# Patient Record
Sex: Female | Born: 1945
Health system: Southern US, Community
[De-identification: ages and names within clinical notes are randomized; demographics above are authoritative.]

## PROBLEM LIST (undated history)

## (undated) DIAGNOSIS — E039 Hypothyroidism, unspecified: Secondary | ICD-10-CM

## (undated) DIAGNOSIS — M65841 Other synovitis and tenosynovitis, right hand: Secondary | ICD-10-CM

## (undated) DIAGNOSIS — R05 Cough: Secondary | ICD-10-CM

## (undated) DIAGNOSIS — R053 Chronic cough: Secondary | ICD-10-CM

## (undated) DIAGNOSIS — E7800 Pure hypercholesterolemia, unspecified: Secondary | ICD-10-CM

## (undated) DIAGNOSIS — Z98811 Dental restoration status: Secondary | ICD-10-CM

## (undated) HISTORY — PX: ABDOMINAL HYSTERECTOMY: SHX81

## (undated) HISTORY — PX: TONSILLECTOMY AND ADENOIDECTOMY: SHX28

## (undated) HISTORY — PX: APPENDECTOMY: SHX54

---

## 1996-02-05 HISTORY — PX: THYROIDECTOMY, PARTIAL: SHX18

## 1997-05-09 ENCOUNTER — Ambulatory Visit (HOSPITAL_COMMUNITY): Admission: RE | Admit: 1997-05-09 | Discharge: 1997-05-10 | Payer: Self-pay | Admitting: General Surgery

## 1997-05-12 ENCOUNTER — Other Ambulatory Visit: Admission: RE | Admit: 1997-05-12 | Discharge: 1997-05-12 | Payer: Self-pay | Admitting: *Deleted

## 1998-06-06 ENCOUNTER — Other Ambulatory Visit: Admission: RE | Admit: 1998-06-06 | Discharge: 1998-06-06 | Payer: Self-pay | Admitting: *Deleted

## 1998-12-13 ENCOUNTER — Encounter: Admission: RE | Admit: 1998-12-13 | Discharge: 1998-12-13 | Payer: Self-pay | Admitting: Gastroenterology

## 1998-12-13 ENCOUNTER — Encounter: Payer: Self-pay | Admitting: Gastroenterology

## 1998-12-19 ENCOUNTER — Encounter: Admission: RE | Admit: 1998-12-19 | Discharge: 1998-12-19 | Payer: Self-pay | Admitting: Gastroenterology

## 1998-12-19 ENCOUNTER — Encounter: Payer: Self-pay | Admitting: Gastroenterology

## 1999-03-15 ENCOUNTER — Encounter: Admission: RE | Admit: 1999-03-15 | Discharge: 1999-03-15 | Payer: Self-pay | Admitting: Gastroenterology

## 1999-03-15 ENCOUNTER — Encounter: Payer: Self-pay | Admitting: Gastroenterology

## 1999-08-21 ENCOUNTER — Other Ambulatory Visit: Admission: RE | Admit: 1999-08-21 | Discharge: 1999-08-21 | Payer: Self-pay | Admitting: *Deleted

## 1999-12-25 ENCOUNTER — Encounter: Payer: Self-pay | Admitting: *Deleted

## 1999-12-25 ENCOUNTER — Encounter: Admission: RE | Admit: 1999-12-25 | Discharge: 1999-12-25 | Payer: Self-pay

## 2000-09-02 ENCOUNTER — Other Ambulatory Visit: Admission: RE | Admit: 2000-09-02 | Discharge: 2000-09-02 | Payer: Self-pay | Admitting: *Deleted

## 2000-12-25 ENCOUNTER — Encounter: Admission: RE | Admit: 2000-12-25 | Discharge: 2000-12-25 | Payer: Self-pay | Admitting: *Deleted

## 2000-12-25 ENCOUNTER — Encounter: Payer: Self-pay | Admitting: *Deleted

## 2000-12-30 ENCOUNTER — Encounter: Admission: RE | Admit: 2000-12-30 | Discharge: 2000-12-30 | Payer: Self-pay | Admitting: Gastroenterology

## 2000-12-30 ENCOUNTER — Encounter: Payer: Self-pay | Admitting: *Deleted

## 2000-12-30 ENCOUNTER — Encounter: Admission: RE | Admit: 2000-12-30 | Discharge: 2000-12-30 | Payer: Self-pay | Admitting: *Deleted

## 2000-12-30 ENCOUNTER — Encounter: Payer: Self-pay | Admitting: Gastroenterology

## 2001-09-22 ENCOUNTER — Other Ambulatory Visit: Admission: RE | Admit: 2001-09-22 | Discharge: 2001-09-22 | Payer: Self-pay | Admitting: Obstetrics and Gynecology

## 2002-01-05 ENCOUNTER — Encounter: Payer: Self-pay | Admitting: Obstetrics and Gynecology

## 2002-01-05 ENCOUNTER — Encounter: Admission: RE | Admit: 2002-01-05 | Discharge: 2002-01-05 | Payer: Self-pay | Admitting: Obstetrics and Gynecology

## 2002-03-23 ENCOUNTER — Ambulatory Visit (HOSPITAL_COMMUNITY): Admission: RE | Admit: 2002-03-23 | Discharge: 2002-03-23 | Payer: Self-pay | Admitting: Gastroenterology

## 2002-09-28 ENCOUNTER — Other Ambulatory Visit: Admission: RE | Admit: 2002-09-28 | Discharge: 2002-09-28 | Payer: Self-pay | Admitting: Obstetrics and Gynecology

## 2003-01-11 ENCOUNTER — Encounter: Admission: RE | Admit: 2003-01-11 | Discharge: 2003-01-11 | Payer: Self-pay | Admitting: Obstetrics and Gynecology

## 2003-11-08 ENCOUNTER — Encounter: Admission: RE | Admit: 2003-11-08 | Discharge: 2003-11-08 | Payer: Self-pay | Admitting: Obstetrics and Gynecology

## 2004-01-12 ENCOUNTER — Encounter: Admission: RE | Admit: 2004-01-12 | Discharge: 2004-01-12 | Payer: Self-pay | Admitting: Obstetrics and Gynecology

## 2004-05-03 ENCOUNTER — Encounter: Admission: RE | Admit: 2004-05-03 | Discharge: 2004-05-03 | Payer: Self-pay | Admitting: Otolaryngology

## 2005-01-15 ENCOUNTER — Encounter: Admission: RE | Admit: 2005-01-15 | Discharge: 2005-01-15 | Payer: Self-pay | Admitting: Obstetrics and Gynecology

## 2005-01-16 ENCOUNTER — Encounter (HOSPITAL_COMMUNITY): Admission: RE | Admit: 2005-01-16 | Discharge: 2005-04-16 | Payer: Self-pay | Admitting: Gastroenterology

## 2005-02-26 ENCOUNTER — Ambulatory Visit (HOSPITAL_COMMUNITY): Admission: RE | Admit: 2005-02-26 | Discharge: 2005-02-26 | Payer: Self-pay | Admitting: Endocrinology

## 2005-03-12 ENCOUNTER — Other Ambulatory Visit: Admission: RE | Admit: 2005-03-12 | Discharge: 2005-03-12 | Payer: Self-pay | Admitting: Interventional Radiology

## 2005-03-12 ENCOUNTER — Encounter: Admission: RE | Admit: 2005-03-12 | Discharge: 2005-03-12 | Payer: Self-pay | Admitting: Endocrinology

## 2005-03-12 ENCOUNTER — Encounter (INDEPENDENT_AMBULATORY_CARE_PROVIDER_SITE_OTHER): Payer: Self-pay | Admitting: *Deleted

## 2005-06-10 ENCOUNTER — Encounter (INDEPENDENT_AMBULATORY_CARE_PROVIDER_SITE_OTHER): Payer: Self-pay | Admitting: Specialist

## 2005-06-10 ENCOUNTER — Ambulatory Visit (HOSPITAL_COMMUNITY): Admission: RE | Admit: 2005-06-10 | Discharge: 2005-06-11 | Payer: Self-pay | Admitting: Surgery

## 2005-06-10 HISTORY — PX: THYROIDECTOMY: SHX17

## 2005-12-21 ENCOUNTER — Emergency Department (HOSPITAL_COMMUNITY): Admission: EM | Admit: 2005-12-21 | Discharge: 2005-12-21 | Payer: Self-pay | Admitting: Emergency Medicine

## 2006-01-16 ENCOUNTER — Encounter: Admission: RE | Admit: 2006-01-16 | Discharge: 2006-01-16 | Payer: Self-pay | Admitting: Obstetrics and Gynecology

## 2006-11-27 ENCOUNTER — Encounter: Admission: RE | Admit: 2006-11-27 | Discharge: 2006-11-27 | Payer: Self-pay | Admitting: Obstetrics and Gynecology

## 2007-01-27 ENCOUNTER — Encounter: Admission: RE | Admit: 2007-01-27 | Discharge: 2007-01-27 | Payer: Self-pay | Admitting: Gastroenterology

## 2007-02-17 ENCOUNTER — Encounter: Admission: RE | Admit: 2007-02-17 | Discharge: 2007-02-17 | Payer: Self-pay | Admitting: Neurology

## 2007-02-19 ENCOUNTER — Ambulatory Visit (HOSPITAL_COMMUNITY): Admission: RE | Admit: 2007-02-19 | Discharge: 2007-02-19 | Payer: Self-pay | Admitting: Neurology

## 2007-07-09 ENCOUNTER — Ambulatory Visit: Payer: Self-pay | Admitting: Internal Medicine

## 2007-07-09 DIAGNOSIS — R059 Cough, unspecified: Secondary | ICD-10-CM | POA: Insufficient documentation

## 2007-07-09 DIAGNOSIS — R05 Cough: Secondary | ICD-10-CM

## 2007-07-09 DIAGNOSIS — M1990 Unspecified osteoarthritis, unspecified site: Secondary | ICD-10-CM | POA: Insufficient documentation

## 2007-07-09 DIAGNOSIS — E049 Nontoxic goiter, unspecified: Secondary | ICD-10-CM | POA: Insufficient documentation

## 2007-07-09 DIAGNOSIS — G454 Transient global amnesia: Secondary | ICD-10-CM

## 2007-07-09 DIAGNOSIS — R42 Dizziness and giddiness: Secondary | ICD-10-CM

## 2007-07-09 DIAGNOSIS — E7800 Pure hypercholesterolemia, unspecified: Secondary | ICD-10-CM | POA: Insufficient documentation

## 2007-07-09 DIAGNOSIS — K219 Gastro-esophageal reflux disease without esophagitis: Secondary | ICD-10-CM | POA: Insufficient documentation

## 2007-07-10 ENCOUNTER — Telehealth (INDEPENDENT_AMBULATORY_CARE_PROVIDER_SITE_OTHER): Payer: Self-pay | Admitting: *Deleted

## 2007-07-23 ENCOUNTER — Ambulatory Visit: Payer: Self-pay | Admitting: Internal Medicine

## 2008-02-02 ENCOUNTER — Encounter: Admission: RE | Admit: 2008-02-02 | Discharge: 2008-02-02 | Payer: Self-pay | Admitting: Obstetrics and Gynecology

## 2009-01-10 ENCOUNTER — Telehealth: Payer: Self-pay | Admitting: Internal Medicine

## 2009-02-10 ENCOUNTER — Encounter: Admission: RE | Admit: 2009-02-10 | Discharge: 2009-02-10 | Payer: Self-pay | Admitting: Obstetrics and Gynecology

## 2010-01-08 ENCOUNTER — Telehealth (INDEPENDENT_AMBULATORY_CARE_PROVIDER_SITE_OTHER): Payer: Self-pay | Admitting: *Deleted

## 2010-01-09 ENCOUNTER — Ambulatory Visit: Payer: Self-pay | Admitting: Internal Medicine

## 2010-01-30 ENCOUNTER — Telehealth: Payer: Self-pay | Admitting: Internal Medicine

## 2010-02-13 ENCOUNTER — Encounter
Admission: RE | Admit: 2010-02-13 | Discharge: 2010-02-13 | Payer: Self-pay | Source: Home / Self Care | Attending: Obstetrics and Gynecology | Admitting: Obstetrics and Gynecology

## 2010-03-08 NOTE — Progress Notes (Signed)
Summary: diet  Phone Note Call from Patient Call back at Work Phone [phone]   Caller: Patient Call For: wert Summary of Call: pt is wanting a copy of refulx diet faxed to her [phone] Initial call taken by: Lacinda Axon,  January 08, 2010 12:29 PM  Follow-up for Phone Call        Dr Sherene Sires, is this okay? You have not seen this pt since 09! Pls advise if it's okay to fax, thanks Follow-up by: Vernie Murders,  January 08, 2010 12:36 PM  Additional Follow-up for Phone Call Additional follow up Details #1::        ok to send her our flyer but strongly rec f/u Additional Follow-up by: Nyoka Cowden MD,  January 08, 2010 2:32 PM    Additional Follow-up for Phone Call Additional follow up Details #2::    Spoke with pt and advised okay to send diet, but MW recs ov.  Pt agrees to come in for appt with MW tommorrw at 4:30 pm. Appt sched. Follow-up by: Vernie Murders,  January 08, 2010 2:43 PM

## 2010-03-08 NOTE — Assessment & Plan Note (Signed)
Summary: Pulmnary/ ext ov for recurrent cough / cyclical features   Primary Misty Woods/Referring Sevilla Murtagh:  Dr. Kirby Funk  CC:  Cough- worse.  History of Present Illness: 5  yowf quit smoking 1970  remembers coughing bad with certain exposures as teenager but since then only had "little cough"  now and then without a pattern previously,  then  since Spring 09 developed refrarctory  Cough is  non productive,  worse after talking  cold drink or exp cold air not as bad while sleeping but sometimes will wake up coughing. Urinary incontinence, gen chest and abd wall muscle aches with severe cough prilosec may have helped , tussionex did not help, prednisone did not help  seen on June 4 and rx with short course of prednisone, continue ppi plus diet plus tramadol only at night and 90& better    January 09, 2010 ov cough resolved on gerd diet and ppi and did fine until about a year ago  much worse esp for the last for the last 3 months worse during day better while sleeping more with voice or laughing sometimes when eat the wrong thing,  no noct spells.  Pt denies any significant sore throat, dysphagia, itching, sneezing,  nasal congestion or excess secretions,  fever, chills, sweats, unintended wt loss, pleuritic or exertional cp, hempoptysis, change in activity tolerance  orthopnea pnd or leg swelling Pt also denies any obvious fluctuation in symptoms with weather or environmental change or other alleviating or aggravating factors.        .  Current Medications (verified): 1)  Levothyroxine Sodium 88 Mcg Tabs (Levothyroxine Sodium) .Marland Kitchen.. 1 Once Daily 2)  Vitamin D 5000 Iu .... Once Daily 3)  Aspirin 81 Mg Tbec (Aspirin) .Marland Kitchen.. 1 Once Daily 4)  Omeprazole 20 Mg  Cpdr (Omeprazole) .... Take One 30-60 Min Before First Meal Daily 5)  Calcium Citrate Plus  Tabs (Multiple Minerals-Vitamins) .Marland Kitchen.. 1 Once Daily  Allergies (verified): 1)  ! Darvon 2)  ! Ultram  Past History:  Past Medical  History: COUGH (ICD-786.2) TRANSIENT GLOBAL AMNESIA (ICD-437.7) HYPERCHOLESTEROLEMIA (ICD-272.0) DEGENERATIVE JOINT DISEASE, MILD (ICD-715.90) VERTIGO (ICD-780.4) GASTROESOPHAGEAL REFLUX DISEASE, MILD (ICD-530.81) GOITER, UNSPECIFIED (ICD-240.9)  Vital Signs:  Patient profile:   65 year old female Height:      62 inches Weight:      139.25 pounds BMI:     25.56 O2 Sat:      97 % on Room air Temp:     98.2 degrees F oral Pulse rate:   58 / minute BP sitting:   122 / 74  (left arm)  Vitals Entered By: Vernie Murders (January 09, 2010 5:08 PM)  O2 Flow:  Room air  Physical Exam  Additional Exam:  Healthy appearing amb wf freq  clearling throat wt 136 > 139 January 09, 2010  HEENT: nl dentition, turbinates, and orophanx. Nl external ear canals without cough reflex Neck without JVD/Nodes/TM Lungs clear to A and P bilaterally without cough on insp or exp maneuvers RRR no s3 or murmur or increase in P2 Abd soft and benign with nl excursion in the supine position. No bruits or organomegaly Ext warm without calf tenderness, cyanosis clubbing or edema Skin warm and dry without lesions     Impression & Recommendations:  Problem # 1:  COUGH (ICD-786.2)  The most common causes of chronic cough in immunocompetent adults include: upper airway cough syndrome (UACS), previously referred to as postnasal drip syndrome,  caused by variety of  rhinosinus conditions; (2) asthma; (3) GERD; (4) chronic bronchitis from cigarette smoking or other inhaled environmental irritants; (5) nonasthmatic eosinophilic bronchitis; and (6) bronchiectasis. These conditions, singly or in combination, have accounted for up to 94% of the causes of chronic cough in prospective studies.   Of the three most common causes of chronic cough, only one can actually cause the other two and perpetuate the cylce of cough inducing airway trauma, inflammation, heightened sensitivity to reflux which is prompted by the cough  itself via a cyclical mechanism.  This may partially respond to steroids and look like asthma and post nasal drainage but never erradicated completely unless the cough and the secondary reflux are eliminated, preferably both at the same time.  See instructions for specific recommendations   Orders: Est. Patient Level IV (81191)  Medications Added to Medication List This Visit: 1)  Levothyroxine Sodium 88 Mcg Tabs (Levothyroxine sodium) .Marland Kitchen.. 1 once daily 2)  Aspirin 81 Mg Tbec (Aspirin) .Marland Kitchen.. 1 once daily 3)  Omeprazole 20 Mg Cpdr (Omeprazole) .... Take one 30-60 min before first meal daily 4)  Calcium Citrate Plus Tabs (Multiple minerals-vitamins) .Marland Kitchen.. 1 once daily 5)  Prednisone 10 Mg Tabs (Prednisone) .... 4 each am x 2days, 2x2days, 1x2days and stop 6)  Tramadol Hcl 50 Mg Tabs (Tramadol hcl) .... One every 4 hours for cough 7)  Omeprazole 40 Mg Cpdr (Omeprazole) .... Take one 30-60 min before first and last meals of the day  Patient Instructions: 1)  Take delsym two tsp every 12 hours and add tramadol 50 mg up to every 4 hours to suppress the urge to cough. Swallowing water or using ice chips/non mint and menthol containing candies (such as lifesavers or sugarless jolly ranchers) are also effective. 2)  Prednisone x 6 days 3)  GERD (REFLUX)  is a common cause of respiratory symptoms. It commonly presents without heartburn and can be treated with medication, but also with lifestyle changes including avoidance of late meals, excessive alcohol, smoking cessation, and avoid fatty foods, chocolate, peppermint, colas, red wine, and acidic juices such as orange juice. NO MINT OR MENTHOL PRODUCTS SO NO COUGH DROPS  4)  USE SUGARLESS CANDY INSTEAD (jolley ranchers)  5)  NO OIL BASED VITAMINS  6)  omeprazole 40 mg Take one 30-60 min before first and last meals of the day  7)  Return after holidays if not 100% satisfied  Prescriptions: OMEPRAZOLE 40 MG CPDR (OMEPRAZOLE) Take one 30-60 min before first  and last meals of the day  #60 x 11   Entered and Authorized by:   Nyoka Cowden MD   Signed by:   Nyoka Cowden MD on 01/09/2010   Method used:   Electronically to        [id number] (retail)       [address]       [place], [place]  [address]       Ph: [phone] or [phone]       Fax: [phone]   RxID:   [id number] TRAMADOL HCL 50 MG  TABS (TRAMADOL HCL) One every 4 hours for cough  #40 x 0   Entered and Authorized by:   Nyoka Cowden MD   Signed by:   Nyoka Cowden MD on 01/09/2010   Method used:   Electronically to        [id number] (retail)       [address]  [place], [place]  [address]       Ph: [phone] or [phone]       Fax: [phone]   RxID:   [id number] PREDNISONE 10 MG  TABS (PREDNISONE) 4 each am x 2days, 2x2days, 1x2days and stop  #14 x 0   Entered and Authorized by:   Nyoka Cowden MD   Signed by:   Nyoka Cowden MD on 01/09/2010   Method used:   Electronically to        [id number] (retail)       [address]       [place], [place]  [address]       Ph: [phone] or [phone]       Fax: [phone]   RxID:   [id number]

## 2010-03-08 NOTE — Progress Notes (Signed)
Summary: medication  Phone Note Call from Patient Call back at Home Phone [phone]   Caller: Patient Call For: Dr. Sherene Sires Summary of Call: Pt phoned Dr. Sherene Sires put her on Omeprazole 20 mg in the morning and 20 at night and it is making her queezy.  She wants to know if she can cut it in half and take 10 in the morning and 10 at night. Patient can be reached at [phone] patient can be paged. Initial call taken by: Vedia Coffer,  January 30, 2010 2:56 PM  Follow-up for Phone Call        called spoke with patient who states she has been taking the omeprazole as prescribed by MW 40mg  before the first and last meals of the day.  pt states she has been having nausea daily since beginning this and would like to know if she may try cutting the dose in half.  MW out of the office this week, will forward to CDY to address. Boone Master CNA/MA  January 30, 2010 3:59 PM   Additional Follow-up for Phone Call Additional follow up Details #1::        Per CDY-yes this is okay for patient to try cutting dose in half.Reynaldo Minium CMA  January 30, 2010 4:37 PM   called and spoke with pt and she is aware per CY to try to cut the dose in half for now. Randell Loop [facility]  January 30, 2010 4:47 PM

## 2010-06-19 NOTE — Procedures (Signed)
AGE OF PATIENT:  [age].   EEG NUMBER:  [id number].   PATIENT IDENTIFICATION:  This patient is an outpatient, referred by Dr.  Amelia Jo for an EEG evaluation.   HISTORY OF PRESENT ILLNESS:  History given is that of a 65 year old,  right-handed Caucasian female with a history of recurrent spells of  transient global amnesia.  She had a history in 2007 of this and now  again.  She had anterograde amnesia for about 6-8 hours.   The patient is currently on simvastatin and levothyroxine.  No history  of stroke or heart disease.   The six-channel EEG recording with one channel representing heart rate  and rhythm exclusively is performed with hyperventilation and photic  stimulation maneuvers on an awake-and-alert patient.  A posterior  dominant background rhythm is established at 9 Hz and attenuates  promptly with eye opening.  Symmetric brain waves are well developed and  show an organized symmetric central and background rhythm.  Photic  stimulation does lead to photic entrainment at 9, 11, and 13 Hz, but no  epileptiform activity is stimulated.  Hyperventilation as performed in  the second of the study does produce some amplitude buildup alternating  with generalized amplitude suppression and slowing.  This is a  physiological normal response and does not indicate any seizure focus.   CONCLUSION:  This is a normal EEG for the patient's age and conscious  state.  There is no evidence of encephalopathic development or focal  abnormalities.      Melvyn Novas, M.D.  Electronically Signed     [id number]  D:  02/19/2007 13:06:46  T:  02/19/2007 13:53:26  Job #:  [id number]

## 2010-06-22 NOTE — Consult Note (Signed)
NAMEVERBIE, BABIC                ACCOUNT NO.:  [id number]   MEDICAL RECORD NO.:  [id number]          PATIENT TYPE:  EMS   LOCATION:  MAJO                         FACILITY:  [facility]   PHYSICIAN:  Marlan Palau, M.D.  DATE OF BIRTH:  22-Jun-1945   DATE OF CONSULTATION:  12/21/2005  DATE OF DISCHARGE:  12/21/2005                                   CONSULTATION   HISTORY OF PRESENT ILLNESS:  Misty [name] is a 65 year old, right-handed,  white female born 1945/06/10 with a history of hyperthyroidism and  hypercholesterolemia.  This patient comes to the emergency room today after  her husband noted that she seemed to be somewhat confused at home, would ask  the same questions again and again.  The patient initially seemed to be  functioning fairly normally, but he did not that the patient had gone into  the bathroom this morning and appeared to be vomiting.  The patient was  otherwise walking and talking normally.  The patient is presently  complaining of a mild headache in the ear.  CT scan of the head in the  emergency room was unremarkable.  Neurology was asked to see this patient  for further evaluation.   PAST MEDICAL HISTORY:  1. History of new onset of amnesia consistent with transient global      amnesia.  2. Hyperthyroidism status post partial thyroidectomy.  3. Appendectomy.  4. Hysterectomy, oophorectomy.  5. Tonsillectomy.  6. Hypercholesterolemia.   MEDICATIONS:  1. Lipitor 80 mg.  2. Levothyroxine 0.1 mg daily.   ALLERGIES:  Darvon.   SOCIAL HISTORY:  Does not smoke.  Drink alcohol on occasion.  The patient is  married, lives in the [place] area, works for [employer], has 3 children who are alive and well.   FAMILY MEDICAL HISTORY:  Mother died of liver disease.  Father died of  coronary artery disease.  One brother with coronary artery disease and one  sister with lung cancer, both living.   REVIEW OF SYSTEMS:  Notable for no  recent, fevers, chills.  The patient does  report a little bit of a headache at this point.  Denies neck pain, neck  stiffness.  She has no problems with chest pains, shortness of breath.  Denies abdominal pain.  Does admit some nausea.  Denies any problems  controlling her bowels or bladder.  Reports no numbness or weakness in the  face, arms, or legs.  No speech problems, swallowing problems, gait  disturbance, blackout episodes.  No previous episodes of amnesia noted.   PHYSICAL EXAMINATION:  VITAL SIGNS:  Blood pressure 169/92, heart rate 73,  respiratory rate 20, temperature is afebrile.  GENERAL:  This patient is a fairly well-developed white female who is alert  and cooperative at the time of examination.  HEENT:  Head is atraumatic.  Pupils are equal, round, and reactive to light.  Disks are flat.  NECK:  Supple.  No carotid bruits.  RESPIRATORY:  Clear.  CARDIOVASCULAR:  Regular rate and rhythm.  No obvious murmurs or rubs noted.  ABDOMEN:  Positive bowel sounds without organomegaly or tenderness noted.  NEUROLOGIC:  Cranial nerves as above.  Facial symmetry is present.  The  patient had good sensation to basic pinprick and soft touch bilaterally.  She has good strength in basic muscles, muscle strength 4/5 bilaterally.  Speech is well enunciated without aphasia.  Motor testing reveals 5/5  strength of all 4 extremities.  Good symmetric motor tone is noted  throughout.  Sensory testing is intact to pinprick, soft touch, mild  sensation throughout.  Position sense is intact to all 4s.  The patient has  good finger to nose, finger to heel to shin.  Gait was not tested.  No drift  is seen.  Deep tendon reflexes are symmetrical and normal.  Toes downgoing  bilaterally.  The patient is able to recall 3 out of 3 words at 1 minute and  again at 5 minutes.  The patient, however, does ask the same questions  repetitively throughout the examination.   CT of the head is unremarkable.    LABORATORY DATA:  Sodium 139, potassium 4.3, chloride 109, BUN 13, glucose  94.  Hematocrit 41, hemoglobin 13.9.  Creatinine 1.1.  White count 6.8,  hemoglobin 13.3, hematocrit 39.2, MCV of 86.8, platelets 267,000.  Myoglobin  level of 157, which is normal.  MB fraction 2.9.  Troponin I less than 0.05,  INR of 1.   IMPRESSION:  Episode of amnesia consistent with transient global amnesia.   This patient has a nonfocal, completely normal neurologic examination with  the exception of evidence of repetitive questions consistent with amnesia.  The patient's clinical episode is consistent with transient global amnesia.  The patient does report headache and has had some nausea and vomiting  earlier today and the episode above could potentially be associated with a  migraine even.  Differential diagnoses includes TIA, seizure, migraine.   PLAN:  1. MRI of the brain.  2. MRI angiogram of the intracranial vessels.  3. Aspirin therapy.  4. Outpatient EG if the above studies are unremarkable.   We will follow this patient as an outpatient.  Thank you very much.      Marlan Palau, M.D.  Electronically Signed     CKW/MEDQ  D:  12/21/2005  T:  12/22/2005  Job:  [id number]   cc:   [facility]

## 2010-06-22 NOTE — Op Note (Signed)
[name], [name]                ACCOUNT NO.:  [id number]   MEDICAL RECORD NO.:  [id number]          PATIENT TYPE:  OIB   LOCATION:  1613                         FACILITY[facility]  [facility]   PHYSICIAN:  Velora Heckler, MD      DATE OF BIRTH:  May 01, 1945   DATE OF PROCEDURE:  06/10/2005  DATE OF DISCHARGE:  06/11/2005                                 OPERATIVE REPORT   PREOPERATIVE DIAGNOSIS:  Right thyroid nodule.   POSTOPERATIVE DIAGNOSIS:  Right thyroid nodule.   PROCEDURE:  Completion thyroidectomy.   SURGEON:  Velora Heckler, MD, FACS   ASSISTANT:  Francina Ames, MD, FACS   ANESTHESIA:  General.   ESTIMATED BLOOD LOSS:  Minimal.   PREPARATION:  Betadine.   COMPLICATIONS:  None.   INDICATIONS:  The patient is a 65 year old white female from [place],  [place].  She is referred by Dr. Dorisann Frames for right thyroid  nodule.  The patient had undergone a left thyroidectomy in 1998 by Dr. Francina Ames for benign disease.  The patient first noted a mass in the right neck  in December 2006.  This has gradually increased in size.  She was seen by  Dr. Dorisann Frames as a consultation.  Thyroid ultrasound was performed.  Nuclear medicine scan was performed.  Thyroid ultrasound with fine-needle  aspiration biopsy was performed February2007.  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  The patient now comes to surgery for  resection.   BODY OF REPORT.:  Procedures was done in OR number 6 at the [facility].  The patient is brought to the operating room, placed in  supine position on the operating room table.  Following administration of  general anesthesia, the patient is prepped and draped in usual strict  aseptic fashion.  After ascertaining that an adequate level of anesthesia  had been obtained, the patient's previous surgical wound is reopened with a  #15 blade.  Dissection was carried through scar tissue down through the  platysma.   Hemostasis was obtained with the electrocautery.  Skin flaps were  developed cephalad and caudad from the thyroid notch to the sternal notch.  A Mahorner self-retaining retractor was placed for exposure.  Strap muscles  are incised in the midline.  Scar tissue was encountered from the previous  resection.  Palpation in the left neck shows no sign of residual thyroid  tissue.  On the right side, strap muscles are reflected laterally.  Right  thyroid lobe was exposed.  There is a dominant nodule occupying the central  portion of the lobe.  Venous tributaries were divided between small and  medium Ligaclip.  Superior pole vessels were dissected out, ligated in  continuity with 2-0 silk ties and medium Ligaclip, and divided.  Gland is  rolled anteriorly.  Branches of the inferior thyroid artery are divided  between small Ligaclip.  Both the superior and inferior parathyroid glands  are identified and preserved on their vascular pedicle.  A small pyramidal  lobe was dissected out and included with the specimen.  Gland  is rolled  anteriorly.  Recurrent nerve was identified and preserved.  Ligament of  Allyson Sabal is transected with electrocautery.  The gland is mobilized across the  midline.  Inferior venous tributaries are ligated in continuity with 2-0  silk ties and divided.  Gland is excised off of the anterior trachea over to  the scar tissue to the left of midline.  No residual thyroid tissue is  identified.  Entire right lobe is submitted fresh to pathology for frozen  section by Dr. Guerry Bruin confirms a follicular neoplasm which he favors  being benign.  Palpation of the neck shows a small nodular density over the  trachea just to the right of midline.  This was gently dissected out.  Pedicle is occluded with a medium Ligaclip in the nodule was excised.  This  appears to be a lymph node.  It is quite firm.  It will be submitted  separately to pathology for review.  No other palpable  adenopathy is  identified.  Neck is irrigated with warm saline and good hemostasis is  noted.  Surgicel is placed over the area of the recurrent nerve and  parathyroid glands.  Strap muscles are reapproximated in the midline with  interrupted 3-0 Vicryl sutures.  Platysma is closed with interrupted 3-0  Vicryl sutures.  Skin is closed with running 4-0 Vicryl subcuticular suture.  Wound is washed and dried.  Benzoin and Steri-Strips are applied.  Sterile  dressings are applied.  The patient is awakened from anesthesia and brought  to the recovery room in stable condition.  The patient tolerated the  procedure well.      Velora Heckler, MD  Electronically Signed     TMG/MEDQ  D:  06/10/2005  T:  06/11/2005  Job:  [id number]   cc:   Dorisann Frames, M.D.  Fax: [phone]   Tasia Catchings, M.D.  Fax: [phone]

## 2010-11-27 ENCOUNTER — Other Ambulatory Visit: Payer: Self-pay | Admitting: Obstetrics and Gynecology

## 2010-11-27 DIAGNOSIS — M8580 Other specified disorders of bone density and structure, unspecified site: Secondary | ICD-10-CM

## 2010-11-27 DIAGNOSIS — Z1231 Encounter for screening mammogram for malignant neoplasm of breast: Secondary | ICD-10-CM

## 2011-02-01 ENCOUNTER — Other Ambulatory Visit: Payer: Self-pay | Admitting: Dermatology

## 2011-02-15 ENCOUNTER — Ambulatory Visit
Admission: RE | Admit: 2011-02-15 | Discharge: 2011-02-15 | Disposition: A | Discharge status: Home / Self Care | Payer: Medicare Other | Source: Ambulatory Visit | Attending: Obstetrics and Gynecology | Admitting: Obstetrics and Gynecology

## 2011-02-15 DIAGNOSIS — M949 Disorder of cartilage, unspecified: Secondary | ICD-10-CM | POA: Diagnosis not present

## 2011-02-15 DIAGNOSIS — Z780 Asymptomatic menopausal state: Secondary | ICD-10-CM | POA: Diagnosis not present

## 2011-02-15 DIAGNOSIS — Z1231 Encounter for screening mammogram for malignant neoplasm of breast: Secondary | ICD-10-CM

## 2011-02-15 DIAGNOSIS — M8580 Other specified disorders of bone density and structure, unspecified site: Secondary | ICD-10-CM

## 2011-02-15 DIAGNOSIS — M899 Disorder of bone, unspecified: Secondary | ICD-10-CM | POA: Diagnosis not present

## 2011-04-02 DIAGNOSIS — R109 Unspecified abdominal pain: Secondary | ICD-10-CM | POA: Diagnosis not present

## 2011-04-02 DIAGNOSIS — M25859 Other specified joint disorders, unspecified hip: Secondary | ICD-10-CM | POA: Diagnosis not present

## 2011-11-29 DIAGNOSIS — E890 Postprocedural hypothyroidism: Secondary | ICD-10-CM | POA: Diagnosis not present

## 2011-12-06 DIAGNOSIS — E039 Hypothyroidism, unspecified: Secondary | ICD-10-CM | POA: Diagnosis not present

## 2011-12-06 DIAGNOSIS — K219 Gastro-esophageal reflux disease without esophagitis: Secondary | ICD-10-CM | POA: Diagnosis not present

## 2011-12-06 DIAGNOSIS — E785 Hyperlipidemia, unspecified: Secondary | ICD-10-CM | POA: Diagnosis not present

## 2011-12-06 DIAGNOSIS — R030 Elevated blood-pressure reading, without diagnosis of hypertension: Secondary | ICD-10-CM | POA: Diagnosis not present

## 2011-12-06 DIAGNOSIS — Z131 Encounter for screening for diabetes mellitus: Secondary | ICD-10-CM | POA: Diagnosis not present

## 2011-12-06 DIAGNOSIS — Z8349 Family history of other endocrine, nutritional and metabolic diseases: Secondary | ICD-10-CM | POA: Diagnosis not present

## 2011-12-06 DIAGNOSIS — E890 Postprocedural hypothyroidism: Secondary | ICD-10-CM | POA: Diagnosis not present

## 2012-01-10 ENCOUNTER — Other Ambulatory Visit: Payer: Self-pay | Admitting: Obstetrics and Gynecology

## 2012-01-10 DIAGNOSIS — Z1231 Encounter for screening mammogram for malignant neoplasm of breast: Secondary | ICD-10-CM

## 2012-02-06 DIAGNOSIS — H2510 Age-related nuclear cataract, unspecified eye: Secondary | ICD-10-CM | POA: Diagnosis not present

## 2012-02-11 DIAGNOSIS — L989 Disorder of the skin and subcutaneous tissue, unspecified: Secondary | ICD-10-CM | POA: Diagnosis not present

## 2012-02-21 ENCOUNTER — Ambulatory Visit
Admission: RE | Admit: 2012-02-21 | Discharge: 2012-02-21 | Disposition: A | Discharge status: Home / Self Care | Payer: Medicare Other | Source: Ambulatory Visit | Attending: Obstetrics and Gynecology | Admitting: Obstetrics and Gynecology

## 2012-02-21 DIAGNOSIS — Z1231 Encounter for screening mammogram for malignant neoplasm of breast: Secondary | ICD-10-CM | POA: Diagnosis not present

## 2012-03-17 DIAGNOSIS — L821 Other seborrheic keratosis: Secondary | ICD-10-CM | POA: Diagnosis not present

## 2012-03-17 DIAGNOSIS — L820 Inflamed seborrheic keratosis: Secondary | ICD-10-CM | POA: Diagnosis not present

## 2012-03-17 DIAGNOSIS — L989 Disorder of the skin and subcutaneous tissue, unspecified: Secondary | ICD-10-CM | POA: Diagnosis not present

## 2012-03-26 DIAGNOSIS — Z8371 Family history of colonic polyps: Secondary | ICD-10-CM | POA: Diagnosis not present

## 2012-04-01 DIAGNOSIS — L821 Other seborrheic keratosis: Secondary | ICD-10-CM | POA: Diagnosis not present

## 2012-04-01 DIAGNOSIS — D239 Other benign neoplasm of skin, unspecified: Secondary | ICD-10-CM | POA: Diagnosis not present

## 2012-10-06 DIAGNOSIS — M76899 Other specified enthesopathies of unspecified lower limb, excluding foot: Secondary | ICD-10-CM | POA: Diagnosis not present

## 2012-10-22 DIAGNOSIS — M76899 Other specified enthesopathies of unspecified lower limb, excluding foot: Secondary | ICD-10-CM | POA: Diagnosis not present

## 2012-10-22 DIAGNOSIS — M25559 Pain in unspecified hip: Secondary | ICD-10-CM | POA: Diagnosis not present

## 2012-10-23 DIAGNOSIS — R238 Other skin changes: Secondary | ICD-10-CM | POA: Diagnosis not present

## 2012-10-27 DIAGNOSIS — M25559 Pain in unspecified hip: Secondary | ICD-10-CM | POA: Diagnosis not present

## 2012-10-27 DIAGNOSIS — M76899 Other specified enthesopathies of unspecified lower limb, excluding foot: Secondary | ICD-10-CM | POA: Diagnosis not present

## 2012-10-29 DIAGNOSIS — M76899 Other specified enthesopathies of unspecified lower limb, excluding foot: Secondary | ICD-10-CM | POA: Diagnosis not present

## 2012-10-29 DIAGNOSIS — M25559 Pain in unspecified hip: Secondary | ICD-10-CM | POA: Diagnosis not present

## 2012-11-03 DIAGNOSIS — M76899 Other specified enthesopathies of unspecified lower limb, excluding foot: Secondary | ICD-10-CM | POA: Diagnosis not present

## 2012-11-03 DIAGNOSIS — M25559 Pain in unspecified hip: Secondary | ICD-10-CM | POA: Diagnosis not present

## 2012-11-05 DIAGNOSIS — M25559 Pain in unspecified hip: Secondary | ICD-10-CM | POA: Diagnosis not present

## 2012-11-05 DIAGNOSIS — M76899 Other specified enthesopathies of unspecified lower limb, excluding foot: Secondary | ICD-10-CM | POA: Diagnosis not present

## 2012-11-10 DIAGNOSIS — M25559 Pain in unspecified hip: Secondary | ICD-10-CM | POA: Diagnosis not present

## 2012-11-10 DIAGNOSIS — M76899 Other specified enthesopathies of unspecified lower limb, excluding foot: Secondary | ICD-10-CM | POA: Diagnosis not present

## 2012-11-12 DIAGNOSIS — M76899 Other specified enthesopathies of unspecified lower limb, excluding foot: Secondary | ICD-10-CM | POA: Diagnosis not present

## 2012-11-12 DIAGNOSIS — M25559 Pain in unspecified hip: Secondary | ICD-10-CM | POA: Diagnosis not present

## 2012-11-17 DIAGNOSIS — M76899 Other specified enthesopathies of unspecified lower limb, excluding foot: Secondary | ICD-10-CM | POA: Diagnosis not present

## 2012-11-24 DIAGNOSIS — M76899 Other specified enthesopathies of unspecified lower limb, excluding foot: Secondary | ICD-10-CM | POA: Diagnosis not present

## 2012-11-24 DIAGNOSIS — M25559 Pain in unspecified hip: Secondary | ICD-10-CM | POA: Diagnosis not present

## 2012-11-27 DIAGNOSIS — L820 Inflamed seborrheic keratosis: Secondary | ICD-10-CM | POA: Diagnosis not present

## 2012-11-30 DIAGNOSIS — L989 Disorder of the skin and subcutaneous tissue, unspecified: Secondary | ICD-10-CM | POA: Diagnosis not present

## 2012-11-30 DIAGNOSIS — L821 Other seborrheic keratosis: Secondary | ICD-10-CM | POA: Diagnosis not present

## 2012-12-01 DIAGNOSIS — M76899 Other specified enthesopathies of unspecified lower limb, excluding foot: Secondary | ICD-10-CM | POA: Diagnosis not present

## 2012-12-01 DIAGNOSIS — M25559 Pain in unspecified hip: Secondary | ICD-10-CM | POA: Diagnosis not present

## 2012-12-08 DIAGNOSIS — Z0000 Encounter for general adult medical examination without abnormal findings: Secondary | ICD-10-CM | POA: Diagnosis not present

## 2012-12-08 DIAGNOSIS — M76899 Other specified enthesopathies of unspecified lower limb, excluding foot: Secondary | ICD-10-CM | POA: Diagnosis not present

## 2012-12-08 DIAGNOSIS — M25559 Pain in unspecified hip: Secondary | ICD-10-CM | POA: Diagnosis not present

## 2012-12-08 DIAGNOSIS — E785 Hyperlipidemia, unspecified: Secondary | ICD-10-CM | POA: Diagnosis not present

## 2012-12-08 DIAGNOSIS — K219 Gastro-esophageal reflux disease without esophagitis: Secondary | ICD-10-CM | POA: Diagnosis not present

## 2012-12-08 DIAGNOSIS — E039 Hypothyroidism, unspecified: Secondary | ICD-10-CM | POA: Diagnosis not present

## 2012-12-08 DIAGNOSIS — Z1331 Encounter for screening for depression: Secondary | ICD-10-CM | POA: Diagnosis not present

## 2012-12-08 DIAGNOSIS — R030 Elevated blood-pressure reading, without diagnosis of hypertension: Secondary | ICD-10-CM | POA: Diagnosis not present

## 2012-12-18 DIAGNOSIS — I10 Essential (primary) hypertension: Secondary | ICD-10-CM | POA: Diagnosis not present

## 2012-12-18 DIAGNOSIS — Z23 Encounter for immunization: Secondary | ICD-10-CM | POA: Diagnosis not present

## 2012-12-18 DIAGNOSIS — E890 Postprocedural hypothyroidism: Secondary | ICD-10-CM | POA: Diagnosis not present

## 2013-01-12 ENCOUNTER — Other Ambulatory Visit: Payer: Self-pay

## 2013-01-12 DIAGNOSIS — Z1231 Encounter for screening mammogram for malignant neoplasm of breast: Secondary | ICD-10-CM

## 2013-01-12 DIAGNOSIS — L821 Other seborrheic keratosis: Secondary | ICD-10-CM | POA: Diagnosis not present

## 2013-02-11 DIAGNOSIS — H2510 Age-related nuclear cataract, unspecified eye: Secondary | ICD-10-CM | POA: Diagnosis not present

## 2013-02-11 DIAGNOSIS — H04129 Dry eye syndrome of unspecified lacrimal gland: Secondary | ICD-10-CM | POA: Diagnosis not present

## 2013-02-18 DIAGNOSIS — R059 Cough, unspecified: Secondary | ICD-10-CM | POA: Diagnosis not present

## 2013-02-18 DIAGNOSIS — B9789 Other viral agents as the cause of diseases classified elsewhere: Secondary | ICD-10-CM | POA: Diagnosis not present

## 2013-02-18 DIAGNOSIS — R05 Cough: Secondary | ICD-10-CM | POA: Diagnosis not present

## 2013-02-23 ENCOUNTER — Ambulatory Visit: Payer: Medicare Other

## 2013-02-23 DIAGNOSIS — R030 Elevated blood-pressure reading, without diagnosis of hypertension: Secondary | ICD-10-CM | POA: Diagnosis not present

## 2013-02-23 DIAGNOSIS — K219 Gastro-esophageal reflux disease without esophagitis: Secondary | ICD-10-CM | POA: Diagnosis not present

## 2013-02-23 DIAGNOSIS — Z79899 Other long term (current) drug therapy: Secondary | ICD-10-CM | POA: Diagnosis not present

## 2013-02-23 DIAGNOSIS — E785 Hyperlipidemia, unspecified: Secondary | ICD-10-CM | POA: Diagnosis not present

## 2013-02-24 ENCOUNTER — Ambulatory Visit
Admission: RE | Admit: 2013-02-24 | Discharge: 2013-02-24 | Disposition: A | Discharge status: Home / Self Care | Payer: Medicare Other | Source: Ambulatory Visit

## 2013-02-24 DIAGNOSIS — Z1231 Encounter for screening mammogram for malignant neoplasm of breast: Secondary | ICD-10-CM

## 2013-07-13 ENCOUNTER — Other Ambulatory Visit: Payer: Self-pay | Admitting: Dermatology

## 2013-07-13 DIAGNOSIS — Z8049 Family history of malignant neoplasm of other genital organs: Secondary | ICD-10-CM | POA: Diagnosis not present

## 2013-07-13 DIAGNOSIS — D239 Other benign neoplasm of skin, unspecified: Secondary | ICD-10-CM | POA: Diagnosis not present

## 2013-07-13 DIAGNOSIS — L820 Inflamed seborrheic keratosis: Secondary | ICD-10-CM | POA: Diagnosis not present

## 2013-07-13 DIAGNOSIS — L723 Sebaceous cyst: Secondary | ICD-10-CM | POA: Diagnosis not present

## 2013-07-13 DIAGNOSIS — L821 Other seborrheic keratosis: Secondary | ICD-10-CM | POA: Diagnosis not present

## 2013-07-13 DIAGNOSIS — D485 Neoplasm of uncertain behavior of skin: Secondary | ICD-10-CM | POA: Diagnosis not present

## 2013-08-10 DIAGNOSIS — H903 Sensorineural hearing loss, bilateral: Secondary | ICD-10-CM | POA: Diagnosis not present

## 2013-08-11 ENCOUNTER — Other Ambulatory Visit (HOSPITAL_COMMUNITY): Payer: Self-pay | Admitting: Orthopedic Surgery

## 2013-08-11 DIAGNOSIS — M75121 Complete rotator cuff tear or rupture of right shoulder, not specified as traumatic: Secondary | ICD-10-CM

## 2013-08-17 ENCOUNTER — Ambulatory Visit (HOSPITAL_COMMUNITY)
Admission: RE | Admit: 2013-08-17 | Discharge: 2013-08-17 | Disposition: A | Discharge status: Home / Self Care | Payer: Medicare Other | Source: Ambulatory Visit | Attending: Orthopedic Surgery | Admitting: Orthopedic Surgery

## 2013-08-17 DIAGNOSIS — M75121 Complete rotator cuff tear or rupture of right shoulder, not specified as traumatic: Secondary | ICD-10-CM

## 2013-08-17 DIAGNOSIS — M67919 Unspecified disorder of synovium and tendon, unspecified shoulder: Secondary | ICD-10-CM | POA: Diagnosis not present

## 2013-08-17 DIAGNOSIS — M25519 Pain in unspecified shoulder: Secondary | ICD-10-CM | POA: Insufficient documentation

## 2013-12-21 DIAGNOSIS — E890 Postprocedural hypothyroidism: Secondary | ICD-10-CM | POA: Diagnosis not present

## 2013-12-21 DIAGNOSIS — K219 Gastro-esophageal reflux disease without esophagitis: Secondary | ICD-10-CM | POA: Diagnosis not present

## 2013-12-21 DIAGNOSIS — E780 Pure hypercholesterolemia: Secondary | ICD-10-CM | POA: Diagnosis not present

## 2013-12-21 DIAGNOSIS — R05 Cough: Secondary | ICD-10-CM | POA: Diagnosis not present

## 2013-12-21 DIAGNOSIS — H9193 Unspecified hearing loss, bilateral: Secondary | ICD-10-CM | POA: Diagnosis not present

## 2013-12-21 DIAGNOSIS — Z23 Encounter for immunization: Secondary | ICD-10-CM | POA: Diagnosis not present

## 2014-02-06 DIAGNOSIS — J0100 Acute maxillary sinusitis, unspecified: Secondary | ICD-10-CM | POA: Diagnosis not present

## 2014-02-17 DIAGNOSIS — H2513 Age-related nuclear cataract, bilateral: Secondary | ICD-10-CM | POA: Diagnosis not present

## 2014-02-22 ENCOUNTER — Other Ambulatory Visit: Payer: Self-pay | Admitting: Obstetrics and Gynecology

## 2014-02-22 DIAGNOSIS — M8580 Other specified disorders of bone density and structure, unspecified site: Secondary | ICD-10-CM

## 2014-03-01 DIAGNOSIS — Z1231 Encounter for screening mammogram for malignant neoplasm of breast: Secondary | ICD-10-CM | POA: Diagnosis not present

## 2014-03-21 ENCOUNTER — Other Ambulatory Visit: Payer: Medicare Other

## 2014-03-25 ENCOUNTER — Ambulatory Visit
Admission: RE | Admit: 2014-03-25 | Discharge: 2014-03-25 | Disposition: A | Discharge status: Home / Self Care | Payer: Medicare Other | Source: Ambulatory Visit | Attending: Obstetrics and Gynecology | Admitting: Obstetrics and Gynecology

## 2014-03-25 DIAGNOSIS — M899 Disorder of bone, unspecified: Secondary | ICD-10-CM | POA: Diagnosis not present

## 2014-03-25 DIAGNOSIS — M8580 Other specified disorders of bone density and structure, unspecified site: Secondary | ICD-10-CM

## 2014-04-05 DIAGNOSIS — J069 Acute upper respiratory infection, unspecified: Secondary | ICD-10-CM | POA: Diagnosis not present

## 2014-05-13 DIAGNOSIS — L989 Disorder of the skin and subcutaneous tissue, unspecified: Secondary | ICD-10-CM | POA: Diagnosis not present

## 2014-06-17 DIAGNOSIS — L821 Other seborrheic keratosis: Secondary | ICD-10-CM | POA: Diagnosis not present

## 2014-06-17 DIAGNOSIS — L989 Disorder of the skin and subcutaneous tissue, unspecified: Secondary | ICD-10-CM | POA: Diagnosis not present

## 2014-06-28 DIAGNOSIS — D763 Other histiocytosis syndromes: Secondary | ICD-10-CM | POA: Diagnosis not present

## 2014-07-28 DIAGNOSIS — Z86018 Personal history of other benign neoplasm: Secondary | ICD-10-CM | POA: Diagnosis not present

## 2014-07-28 DIAGNOSIS — L821 Other seborrheic keratosis: Secondary | ICD-10-CM | POA: Diagnosis not present

## 2014-07-28 DIAGNOSIS — D225 Melanocytic nevi of trunk: Secondary | ICD-10-CM | POA: Diagnosis not present

## 2014-12-10 DIAGNOSIS — Z23 Encounter for immunization: Secondary | ICD-10-CM | POA: Diagnosis not present

## 2014-12-27 DIAGNOSIS — E890 Postprocedural hypothyroidism: Secondary | ICD-10-CM | POA: Diagnosis not present

## 2014-12-27 DIAGNOSIS — M8588 Other specified disorders of bone density and structure, other site: Secondary | ICD-10-CM | POA: Diagnosis not present

## 2014-12-27 DIAGNOSIS — E7800 Pure hypercholesterolemia, unspecified: Secondary | ICD-10-CM | POA: Diagnosis not present

## 2014-12-27 DIAGNOSIS — K219 Gastro-esophageal reflux disease without esophagitis: Secondary | ICD-10-CM | POA: Diagnosis not present

## 2014-12-27 DIAGNOSIS — Z1389 Encounter for screening for other disorder: Secondary | ICD-10-CM | POA: Diagnosis not present

## 2014-12-27 DIAGNOSIS — Z0000 Encounter for general adult medical examination without abnormal findings: Secondary | ICD-10-CM | POA: Diagnosis not present

## 2015-02-23 DIAGNOSIS — H524 Presbyopia: Secondary | ICD-10-CM | POA: Diagnosis not present

## 2015-02-23 DIAGNOSIS — H2513 Age-related nuclear cataract, bilateral: Secondary | ICD-10-CM | POA: Diagnosis not present

## 2015-03-16 DIAGNOSIS — Z1231 Encounter for screening mammogram for malignant neoplasm of breast: Secondary | ICD-10-CM | POA: Diagnosis not present

## 2015-04-05 DIAGNOSIS — Z5181 Encounter for therapeutic drug level monitoring: Secondary | ICD-10-CM | POA: Diagnosis not present

## 2015-04-05 DIAGNOSIS — E538 Deficiency of other specified B group vitamins: Secondary | ICD-10-CM | POA: Diagnosis not present

## 2015-04-05 DIAGNOSIS — E039 Hypothyroidism, unspecified: Secondary | ICD-10-CM | POA: Diagnosis not present

## 2015-08-12 DIAGNOSIS — L237 Allergic contact dermatitis due to plants, except food: Secondary | ICD-10-CM | POA: Diagnosis not present

## 2015-08-21 DIAGNOSIS — R05 Cough: Secondary | ICD-10-CM | POA: Diagnosis not present

## 2015-09-18 DIAGNOSIS — R05 Cough: Secondary | ICD-10-CM | POA: Diagnosis not present

## 2015-10-06 ENCOUNTER — Ambulatory Visit (INDEPENDENT_AMBULATORY_CARE_PROVIDER_SITE_OTHER): Payer: Medicare Other | Admitting: Pulmonary Disease

## 2015-10-06 ENCOUNTER — Encounter (INDEPENDENT_AMBULATORY_CARE_PROVIDER_SITE_OTHER): Payer: Self-pay

## 2015-10-06 ENCOUNTER — Encounter: Payer: Self-pay | Admitting: Pulmonary Disease

## 2015-10-06 ENCOUNTER — Ambulatory Visit (INDEPENDENT_AMBULATORY_CARE_PROVIDER_SITE_OTHER)
Admission: RE | Admit: 2015-10-06 | Discharge: 2015-10-06 | Disposition: A | Discharge status: Home / Self Care | Payer: Medicare Other | Source: Ambulatory Visit | Attending: Pulmonary Disease | Admitting: Pulmonary Disease

## 2015-10-06 VITALS — BP 140/78 | HR 66 | Ht 61.5 in | Wt 133.4 lb

## 2015-10-06 DIAGNOSIS — R05 Cough: Secondary | ICD-10-CM

## 2015-10-06 DIAGNOSIS — R059 Cough, unspecified: Secondary | ICD-10-CM

## 2015-10-06 MED ORDER — HYDROCODONE-HOMATROPINE 5-1.5 MG/5ML PO SYRP: 5.0000 mL | ORAL_SOLUTION | Freq: Four times a day (QID) | ORAL | 0 refills | Status: DC | PRN

## 2015-10-06 MED ORDER — CHLORPHENIRAMINE MALEATE 4 MG PO TABS
ORAL_TABLET | ORAL | 0 refills | Status: DC
Start: 2015-10-06 — End: 2016-04-03

## 2015-10-06 MED ORDER — AZELASTINE-FLUTICASONE 137-50 MCG/ACT NA SUSP: 1.0000 | Freq: Two times a day (BID) | NASAL | 0 refills | Status: DC

## 2015-10-06 NOTE — Patient Instructions (Addendum)
We will start you on chlorpheniramine 8 mg 3 times a day, and dymista nasal spray 1 puff to each nostril twice daily. Stop taking the flonase nasal spray. I will prescribe hycodan cough syrup Continue the protonix and zantac as prescribed. I will check a FeNO today.   Return to clinic in 1 month to review response to therapy.

## 2015-10-06 NOTE — Progress Notes (Signed)
Misty Woods    [id number]    Sep 04, 1945  Primary Care Physician:GRIFFIN,JOHN [name], MD  Referring Physician: No referring provider defined for this encounter.  Chief complaint:  Evaluation for chronic cough.  HPI: Misty Woods is a 70 year old with history of chronic cough for the past 9 years. She was originally evaluated by Dr. Melvyn Novas in 2011 she was diagnosed with LPR, GERD and was placed on PPI twice a day and diet modification. This helped somewhat with her symptoms however for the past year she's noticed worsening cough. She was recently started on Flonase and Zantac was added to Protonix 40 mg twice a day. This has not changed his symptoms.  She has chronic daily hacking cough. This is worsened by cold air of water. She does not have any shortness of breath, wheezing. She states that her GERD symptoms are well controlled on the current antiacid therapy.   Outpatient Encounter Prescriptions as of 10/06/2015  Medication Sig  . calcium citrate-vitamin D (CITRACAL+D) 315-200 MG-UNIT tablet Take by mouth.  . cyanocobalamin (TH VITAMIN B12) 100 MCG tablet Take by mouth.  . fluticasone (FLONASE) 50 MCG/ACT nasal spray Place 2 sprays into both nostrils daily.  Marland Kitchen levothyroxine (SYNTHROID, LEVOTHROID) 88 MCG tablet Take by mouth.  Marland Kitchen omeprazole (PRILOSEC) 40 MG capsule   . ranitidine (ZANTAC) 150 MG tablet Take 150 mg by mouth 2 (two) times daily.   No facility-administered encounter medications on file as of 10/06/2015.     Allergies as of 10/06/2015 - Review Complete 10/06/2015  Allergen Reaction Noted  . Propoxyphene hcl    . Tramadol hcl    . Propoxyphene Rash 02/11/2012    No past medical history on file.  No past surgical history on file.  No family history on file.  Social History   Social History  . Marital status: Married    Spouse name: N/A  . Number of children: N/A  . Years of education: N/A   Occupational History  . Not on file.   Social History  Main Topics  . Smoking status: Former Smoker    Packs/day: 1.00    Years: 5.00    Types: Cigarettes    Quit date: 02/05/1968  . Smokeless tobacco: Never Used  . Alcohol use Yes     Comment: Social  . Drug use: No  . Sexual activity: Not on file   Other Topics Concern  . Not on file   Social History Narrative  . No narrative on file    Review of systems: Review of Systems  Constitutional: Negative for fever and chills.  HENT: Negative.   Eyes: Negative for blurred vision.  Respiratory: as per HPI  Cardiovascular: Negative for chest pain and palpitations.  Gastrointestinal: Negative for vomiting, diarrhea, blood per rectum. Genitourinary: Negative for dysuria, urgency, frequency and hematuria.  Musculoskeletal: Negative for myalgias, back pain and joint pain.  Skin: Negative for itching and rash.  Neurological: Negative for dizziness, tremors, focal weakness, seizures and loss of consciousness.  Endo/Heme/Allergies: Negative for environmental allergies.  Psychiatric/Behavioral: Negative for depression, suicidal ideas and hallucinations.  All other systems reviewed and are negative.   Physical Exam: Blood pressure 140/78, pulse 66, height 5' 1.5" (1.562 m), weight 133 lb 6.4 oz (60.5 kg), SpO2 97 %. Gen:      No acute distress HEENT:  EOMI, sclera anicteric, erythematous posterior pharynx Neck:     No masses; no thyromegaly Lungs:    Clear to  auscultation bilaterally; normal respiratory effort CV:         Regular rate and rhythm; no murmurs Abd:      + bowel sounds; soft, non-tender; no palpable masses, no distension Ext:    No edema; adequate peripheral perfusion Skin:      Warm and dry; no rash Neuro: alert and oriented x 3 Psych: normal mood and affect  Data Reviewed:  Assessment:  Chronic cough  Likely upper airway cough syndrome driven predominantly by GERD, postnasal drip. The symptoms are not very typical of asthma. She is already on maximal anti acid therapy  and will continue the same. I'll do a stepwise approach by treating her postnasal drip first. She'll get started on first generation antihistamine chlorpheniramine and I'll change the Flonase to dymista nasal spray. She'll continue the Mucinex and add Hycodan for cough suppression.  I educated her on behavioral changes to deal with cough including conscious suppression of the urge to cough, use of throat lozenges.  Plan/Recommendations: - Start chlorpheniramine 8 mg tid, change flonase to dymista - Continue protonix and zantac - Add hycodan cough syrup. Continue mucinex. - Check FeNo  [name] MD [facility] Pager [phone] If no answer or after 3pm call: [phone] 10/06/2015, 11:43 AM  CC: No ref. provider found

## 2015-10-11 ENCOUNTER — Telehealth: Payer: Self-pay | Admitting: Pulmonary Disease

## 2015-10-11 NOTE — Telephone Encounter (Signed)
Notes Recorded by [name], MD on 10/10/2015 at 2:09 AM EDT Please let the patient know that her CXR is normal  Called and spoke with pt and she is aware of cxr results. Nothing further is needed.

## 2015-10-25 ENCOUNTER — Encounter: Payer: Self-pay | Admitting: Pulmonary Disease

## 2015-10-25 ENCOUNTER — Ambulatory Visit (INDEPENDENT_AMBULATORY_CARE_PROVIDER_SITE_OTHER): Payer: Medicare Other | Admitting: Pulmonary Disease

## 2015-10-25 ENCOUNTER — Telehealth: Payer: Self-pay | Admitting: Pulmonary Disease

## 2015-10-25 VITALS — BP 110/80 | HR 67 | Ht 61.5 in | Wt 131.2 lb

## 2015-10-25 DIAGNOSIS — R05 Cough: Secondary | ICD-10-CM

## 2015-10-25 DIAGNOSIS — R059 Cough, unspecified: Secondary | ICD-10-CM

## 2015-10-25 LAB — NITRIC OXIDE: NITRIC OXIDE: 19

## 2015-10-25 MED ORDER — FLUTICASONE-SALMETEROL 250-50 MCG/DOSE IN AEPB: 1.0000 | INHALATION_SPRAY | Freq: Every day | RESPIRATORY_TRACT | 5 refills | Status: DC

## 2015-10-25 NOTE — Progress Notes (Signed)
Misty Woods    [id number]    09/06/1945  Primary Care Physician:GRIFFIN,JOHN [name], MD  Referring Physician: Lavone Orn, MD [place]. [employer] [address], [place] [address]  Chief complaint:  Follow up for chronic cough.  HPI: Misty Woods is a 70 year old with history of chronic cough for the past 9 years. She was originally evaluated by Dr. Melvyn Novas in 2011 she was diagnosed with LPR, GERD and was placed on PPI twice a day and diet modification. This helped somewhat with her symptoms however for the past year she's noticed worsening cough. She was recently started on Flonase and Zantac was added to Protonix 40 mg twice a day. This has not changed his symptoms. We started her on chlorpheniramine, dymista, hycodan with no change in symptoms. She is not able to tolerate the chlorpheniramine as it makes her loopy. She is only taking this at 4 mg bid.   She has chronic daily hacking cough. This is worsened by cold air of water. She does not have any shortness of breath, wheezing. She states that her GERD symptoms are well controlled on the current antiacid therapy.   Outpatient Encounter Prescriptions as of 10/25/2015  Medication Sig  . Azelastine-Fluticasone (DYMISTA) 137-50 MCG/ACT SUSP Place 1 puff into the nose 2 (two) times daily.  . calcium citrate-vitamin D (CITRACAL+D) 315-200 MG-UNIT tablet Take by mouth. Takes 3 times a week  . chlorpheniramine (CHLOR-TRIMETON) 4 MG tablet Take 2 tablets 3 times daily (Patient taking differently: 4 mg. Takes 1 tab in the am and takes 1 tab at [employer])  . cyanocobalamin (TH VITAMIN B12) 100 MCG tablet Take by mouth.  Marland Kitchen HYDROcodone-homatropine (HYCODAN) 5-1.5 MG/5ML syrup Take 5 mLs by mouth every 6 (six) hours as needed for cough.  . levothyroxine (SYNTHROID, LEVOTHROID) 88 MCG tablet Take by mouth.  Marland Kitchen omeprazole (PRILOSEC) 40 MG capsule   . ranitidine (ZANTAC) 150 MG tablet Take 150 mg by mouth 2 (two) times daily.  . fluticasone  (FLONASE) 50 MCG/ACT nasal spray Place 2 sprays into both nostrils daily.   No facility-administered encounter medications on file as of 10/25/2015.     Allergies as of 10/25/2015 - Review Complete 10/25/2015  Allergen Reaction Noted  . Propoxyphene hcl    . Tramadol hcl    . Propoxyphene Rash 02/11/2012    No past medical history on file.  No past surgical history on file.  No family history on file.  Social History   Social History  . Marital status: Married    Spouse name: N/A  . Number of children: N/A  . Years of education: N/A   Occupational History  . Not on file.   Social History Main Topics  . Smoking status: Former Smoker    Packs/day: 1.00    Years: 5.00    Types: Cigarettes    Quit date: 02/05/1968  . Smokeless tobacco: Never Used  . Alcohol use Yes     Comment: Social  . Drug use: No  . Sexual activity: Not on file   Other Topics Concern  . Not on file   Social History Narrative  . No narrative on file    Review of systems: Review of Systems  Constitutional: Negative for fever and chills.  HENT: Negative.   Eyes: Negative for blurred vision.  Respiratory: as per HPI  Cardiovascular: Negative for chest pain and palpitations.  Gastrointestinal: Negative for vomiting, diarrhea, blood per rectum. Genitourinary: Negative for dysuria,  urgency, frequency and hematuria.  Musculoskeletal: Negative for myalgias, back pain and joint pain.  Skin: Negative for itching and rash.  Neurological: Negative for dizziness, tremors, focal weakness, seizures and loss of consciousness.  Endo/Heme/Allergies: Negative for environmental allergies.  Psychiatric/Behavioral: Negative for depression, suicidal ideas and hallucinations.  All other systems reviewed and are negative.   Physical Exam: Blood pressure 140/78, pulse 66, height 5' 1.5" (1.562 m), weight 133 lb 6.4 oz (60.5 kg), SpO2 97 %. Gen:      No acute distress HEENT:  EOMI, sclera anicteric, erythematous  posterior pharynx Neck:     No masses; no thyromegaly Lungs:    Clear to auscultation bilaterally; normal respiratory effort CV:         Regular rate and rhythm; no murmurs Abd:      + bowel sounds; soft, non-tender; no palpable masses, no distension Ext:    No edema; adequate peripheral perfusion Skin:      Warm and dry; no rash Neuro: alert and oriented x 3 Psych: normal mood and affect  Data Reviewed: CXR 10/06/15- No acute abnormalities. Images reviewed.  FENO 10/25/15- 19  Assessment:  Chronic cough Likely upper airway cough syndrome driven predominantly by GERD, postnasal drip. Her symptoms have not responded to maximal acid suppression, antihistamine with chlorpheniramine and dymista. She is not tolerant of chlorpheniramine as it causes dizziness. I will switch this to zyrtec.   The symptoms are not very typical of asthma and FENO is low in office today. Even so we will try a pred taper and advair 250/50.   Refer to ENT for further eval  I re educated her on behavioral changes to deal with cough including conscious suppression of the urge to cough, use of throat lozenges.  Plan/Recommendations: - We'll change the chlorphenirmine to Zyrtec10 mg daily - Continue dymista nasal spray - Continue protonix and zantac - Steroid taper starting at 40 mg. Reduce dose by 10 mg every 2 days - ENT referral  [name] MD [facility] Pager [phone] If no answer or after 3pm call: [phone] 10/25/2015, 3:00 PM  CC: Lavone Orn, MD

## 2015-10-25 NOTE — Patient Instructions (Signed)
We will change your chlorphentermine to Zyrtec 10 mg daily Continue dymista, Protonix, Xanax. We'll give a short prednisone taper starting at 40 mg. Reduce dose to 10 mg every 2 days. We will also start on Advair 250/50. Refer to ENT for evaluation of cough.  Return in 1 month.

## 2015-10-25 NOTE — Telephone Encounter (Signed)
Last ov today with PM Instructions   We will change your chlorphentermine to Zyrtec 10 mg daily Continue dymista, Protonix, Xanax. We'll give a short prednisone taper starting at 40 mg. Reduce dose to 10 mg every 2 days. We will also start on Advair 250/50. Refer to ENT for evaluation of cough.  Return in 1 month.   Called spoke with pt. She states that advair and a pred taper should have been sent to [facility] on [address]. I explained to her that I would send the rx today. She voiced understanding and had no further questions. Both rx sent to the pharmacy. Nothing further needed.

## 2015-10-26 ENCOUNTER — Encounter: Payer: Self-pay | Admitting: Pulmonary Disease

## 2015-10-26 MED ORDER — PREDNISONE 10 MG PO TABS: ORAL_TABLET | ORAL | 0 refills | Status: DC

## 2015-10-26 NOTE — Telephone Encounter (Signed)
Instructions   We will change your chlorphentermine to Zyrtec 10 mg daily Continue dymista, Protonix, Xanax. We'll give a short prednisone taper starting at 40 mg. Reduce dose to 10 mg every 2 days. We will also start on Advair 250/50. Refer to ENT for evaluation of cough.  Return in 1 month.

## 2015-10-27 ENCOUNTER — Other Ambulatory Visit: Payer: Self-pay | Admitting: Pulmonary Disease

## 2015-10-27 MED ORDER — FLUTICASONE-SALMETEROL 250-50 MCG/DOSE IN AEPB: 1.0000 | INHALATION_SPRAY | Freq: Two times a day (BID) | RESPIRATORY_TRACT | 0 refills | Status: DC

## 2015-10-27 NOTE — Telephone Encounter (Signed)
Patient checking on rx being sent to mail order - pr

## 2015-10-27 NOTE — Telephone Encounter (Signed)
Patient called regarding her email - she hasn't gotten a response. She would like her med to be sent to her mail order. She can be reached at [phone]

## 2015-10-31 ENCOUNTER — Telehealth: Payer: Self-pay | Admitting: Pulmonary Disease

## 2015-10-31 NOTE — Telephone Encounter (Signed)
Patient states that the Advair is too expensive.  She wants to know if there is an alternative medication that may be cheaper.  She said that she does not have her formulary, but if Dr. [name] can give her the name of some medications that are equivalent to Advair, she can call [facility] and find out if they will cover it.   Dr. [name], please advise.

## 2015-11-01 NOTE — Telephone Encounter (Signed)
Alternatives are airduo, breo, symbicort, dulera.

## 2015-11-01 NOTE — Telephone Encounter (Signed)
LMTCB x1 for pt.  

## 2015-11-02 NOTE — Telephone Encounter (Signed)
Pt returning call pt said to call her on cell @ [phone].Misty Woods

## 2015-11-02 NOTE — Telephone Encounter (Signed)
lmtcb x2 

## 2015-11-02 NOTE — Telephone Encounter (Signed)
Pt returned call. Informed her of PM's recs. She voiced understanding and had no further questions.

## 2015-11-07 ENCOUNTER — Encounter: Payer: Self-pay | Admitting: Pulmonary Disease

## 2015-11-07 ENCOUNTER — Ambulatory Visit: Payer: Medicare Other | Admitting: Pulmonary Disease

## 2015-11-07 ENCOUNTER — Telehealth: Payer: Self-pay | Admitting: Pulmonary Disease

## 2015-11-07 NOTE — Telephone Encounter (Signed)
Attempted to contact pt. No answer, no option to leave a message. Will try back.  

## 2015-11-08 NOTE — Telephone Encounter (Signed)
I have sent an email to the pt per her request to let her know how to use the advair. Nothing further is needed.

## 2015-11-20 DIAGNOSIS — Z23 Encounter for immunization: Secondary | ICD-10-CM | POA: Diagnosis not present

## 2015-11-23 DIAGNOSIS — D225 Melanocytic nevi of trunk: Secondary | ICD-10-CM | POA: Diagnosis not present

## 2015-11-23 DIAGNOSIS — D2272 Melanocytic nevi of left lower limb, including hip: Secondary | ICD-10-CM | POA: Diagnosis not present

## 2015-11-23 DIAGNOSIS — Z86018 Personal history of other benign neoplasm: Secondary | ICD-10-CM | POA: Diagnosis not present

## 2015-11-23 DIAGNOSIS — L821 Other seborrheic keratosis: Secondary | ICD-10-CM | POA: Diagnosis not present

## 2015-11-23 DIAGNOSIS — L723 Sebaceous cyst: Secondary | ICD-10-CM | POA: Diagnosis not present

## 2015-11-23 DIAGNOSIS — Z23 Encounter for immunization: Secondary | ICD-10-CM | POA: Diagnosis not present

## 2015-11-30 ENCOUNTER — Encounter: Payer: Self-pay | Admitting: Pulmonary Disease

## 2015-11-30 ENCOUNTER — Ambulatory Visit (INDEPENDENT_AMBULATORY_CARE_PROVIDER_SITE_OTHER): Payer: Medicare Other | Admitting: Pulmonary Disease

## 2015-11-30 VITALS — BP 124/62 | HR 81 | Ht 61.5 in | Wt 133.2 lb

## 2015-11-30 DIAGNOSIS — R05 Cough: Secondary | ICD-10-CM | POA: Diagnosis not present

## 2015-11-30 DIAGNOSIS — R059 Cough, unspecified: Secondary | ICD-10-CM

## 2015-11-30 NOTE — Patient Instructions (Signed)
We will schedule you for CT scan of chest without contrast and a methacholine challenge test. Return in 1 month.

## 2015-11-30 NOTE — Progress Notes (Signed)
Misty Woods    [id number]    Jun 08, 1945  Primary Care Physician:GRIFFIN,JOHN [name], MD  Referring Physician: Lavone Orn, MD [place]. [address] [employer] [address],  [address]  Chief complaint:  Follow up for chronic cough.  HPI: Misty Woods is a 70 year old with history of chronic cough for the past 9 years. She was originally evaluated by Dr. Melvyn Novas in 2011 she was diagnosed with LPR, GERD and was placed on PPI twice a day and diet modification. This helped somewhat with her symptoms however for the past year she's noticed worsening cough. She was recently started on Flonase and Zantac was added to Protonix 40 mg twice a day. This has not changed his symptoms. We started her on chlorpheniramine, dymista, hycodan with no change in symptoms. She is not able to tolerate the chlorpheniramine as it makes her loopy. She is only taking this at 4 mg bid.   She has chronic daily hacking cough. This is worsened by cold air of water. She does not have any shortness of breath, wheezing. She states that her GERD symptoms are well controlled on the current antiacid therapy.  Interim history: Seen by Dr. [name], ENT who did a laryngoscopy. As per the patient he did not find anything abnormal. He may consider doing a CT scan of the sinuses. She has stopped the dymista nasal spray since she feels it does not help. She continues on the Zyrtec.   Outpatient Encounter Prescriptions as of 11/30/2015  Medication Sig  . calcium citrate-vitamin D (CITRACAL+D) 315-200 MG-UNIT tablet Take by mouth.  . chlorpheniramine (CHLOR-TRIMETON) 4 MG tablet Take 2 tablets 3 times daily (Patient taking differently: 4 mg. Takes 1 tab in the am and takes 1 tab at [employer])  . cyanocobalamin (TH VITAMIN B12) 100 MCG tablet Take by mouth.  . Fluticasone-Salmeterol (ADVAIR DISKUS) 250-50 MCG/DOSE AEPB Inhale 1 puff into the lungs 2 (two) times daily.  Marland Kitchen levothyroxine (SYNTHROID, LEVOTHROID) 88 MCG tablet Take by  mouth.  Marland Kitchen omeprazole (PRILOSEC) 40 MG capsule   . ranitidine (ZANTAC) 150 MG tablet Take 150 mg by mouth 2 (two) times daily.  . [DISCONTINUED] Azelastine-Fluticasone (DYMISTA) 137-50 MCG/ACT SUSP Place 1 puff into the nose 2 (two) times daily.  . [DISCONTINUED] calcium citrate-vitamin D (CITRACAL+D) 315-200 MG-UNIT tablet Take by mouth. Takes 3 times a week  . [DISCONTINUED] fluticasone (FLONASE) 50 MCG/ACT nasal spray Place 2 sprays into both nostrils daily.  . [DISCONTINUED] HYDROcodone-homatropine (HYCODAN) 5-1.5 MG/5ML syrup Take 5 mLs by mouth every 6 (six) hours as needed for cough.  . [DISCONTINUED] predniSONE (DELTASONE) 10 MG tablet Take 40 mg daily x 2 days, 30 mg daily x 2 days, 20 mg daily x 2 days, 10 mg daily x 2 days then stop   No facility-administered encounter medications on file as of 11/30/2015.     Allergies as of 11/30/2015 - Review Complete 11/30/2015  Allergen Reaction Noted  . Propoxyphene hcl    . Tramadol hcl    . Propoxyphene Rash 02/11/2012    No past medical history on file.  No past surgical history on file.  No family history on file.  Social History   Social History  . Marital status: Married    Spouse name: N/A  . Number of children: N/A  . Years of education: N/A   Occupational History  . Not on file.   Social History Main Topics  . Smoking status: Former Smoker  Packs/day: 1.00    Years: 5.00    Types: Cigarettes    Quit date: 02/05/1968  . Smokeless tobacco: Never Used  . Alcohol use Yes     Comment: Social  . Drug use: No  . Sexual activity: Not on file   Other Topics Concern  . Not on file   Social History Narrative  . No narrative on file    Review of systems: Review of Systems  Constitutional: Negative for fever and chills.  HENT: Negative.   Eyes: Negative for blurred vision.  Respiratory: as per HPI  Cardiovascular: Negative for chest pain and palpitations.  Gastrointestinal: Negative for vomiting, diarrhea,  blood per rectum. Genitourinary: Negative for dysuria, urgency, frequency and hematuria.  Musculoskeletal: Negative for myalgias, back pain and joint pain.  Skin: Negative for itching and rash.  Neurological: Negative for dizziness, tremors, focal weakness, seizures and loss of consciousness.  Endo/Heme/Allergies: Negative for environmental allergies.  Psychiatric/Behavioral: Negative for depression, suicidal ideas and hallucinations.  All other systems reviewed and are negative.   Physical Exam: Blood pressure 140/78, pulse 66, height 5' 1.5" (1.562 m), weight 133 lb 6.4 oz (60.5 kg), SpO2 97 %. Gen:      No acute distress HEENT:  EOMI, sclera anicteric, erythematous posterior pharynx Neck:     No masses; no thyromegaly Lungs:    Clear to auscultation bilaterally; normal respiratory effort CV:         Regular rate and rhythm; no murmurs Abd:      + bowel sounds; soft, non-tender; no palpable masses, no distension Ext:    No edema; adequate peripheral perfusion Skin:      Warm and dry; no rash Neuro: alert and oriented x 3 Psych: normal mood and affect  Data Reviewed: CXR 10/06/15- No acute abnormalities. Images reviewed.  FENO 10/25/15- 19  Assessment:  Chronic cough Likely upper airway cough syndrome driven predominantly by GERD, postnasal drip. Her symptoms have not responded to maximal acid suppression, antihistamine with chlorpheniramine and dymista. She is not tolerant of chlorpheniramine as it causes dizziness so this was switched to zyrtec.   The symptoms are not very typical of asthma and FENO is low in office today. Even so we tried pred taper and advair 250/50 with small improvement in symptoms. Since her symptoms are persistent we'll do a CT scan of the chest and a methacholine challenge test. She will likely needs to be seen by GI for her persistent GERD symptoms. She'll discuss getting a referral back to GI when she meets her primary care physician.  I re educated her on  behavioral changes to deal with cough including conscious suppression of the urge to cough, use of throat lozenges.  Plan/Recommendations: - Continue Zyrtec10 mg daily - Continue protonix and zantac - Continue advair until methacholine challenge test can be done.  - CT of chest - Follow up with ENT. Consider GI referral  [name] MD [facility] Pager [phone] If no answer or after 3pm call: [phone] 11/30/2015, 12:02 PM  CC: Lavone Orn, MD

## 2015-12-01 NOTE — Addendum Note (Signed)
Addended by: Maryanna Shape A on: 12/01/2015 05:05 PM   Modules accepted: Orders

## 2015-12-05 ENCOUNTER — Ambulatory Visit (INDEPENDENT_AMBULATORY_CARE_PROVIDER_SITE_OTHER)
Admission: RE | Admit: 2015-12-05 | Discharge: 2015-12-05 | Disposition: A | Discharge status: Home / Self Care | Payer: Medicare Other | Source: Ambulatory Visit | Attending: Pulmonary Disease | Admitting: Pulmonary Disease

## 2015-12-05 DIAGNOSIS — R05 Cough: Secondary | ICD-10-CM | POA: Diagnosis not present

## 2015-12-05 DIAGNOSIS — R059 Cough, unspecified: Secondary | ICD-10-CM

## 2015-12-08 ENCOUNTER — Ambulatory Visit (HOSPITAL_COMMUNITY)
Admission: RE | Admit: 2015-12-08 | Discharge: 2015-12-08 | Disposition: A | Discharge status: Home / Self Care | Payer: Medicare Other | Source: Ambulatory Visit | Attending: Pulmonary Disease | Admitting: Pulmonary Disease

## 2015-12-08 DIAGNOSIS — R05 Cough: Secondary | ICD-10-CM

## 2015-12-08 DIAGNOSIS — R059 Cough, unspecified: Secondary | ICD-10-CM

## 2015-12-08 LAB — PULMONARY FUNCTION TEST
FEF 25-75 PRE: 1.23 L/s
FEF 25-75 Post: 0.62 L/sec
FEF2575-%CHANGE-POST: -49 %
FEF2575-%PRED-POST: 36 %
FEF2575-%PRED-PRE: 72 %
FEV1-%CHANGE-POST: -15 %
FEV1-%PRED-POST: 81 %
FEV1-%PRED-PRE: 97 %
FEV1-PRE: 1.89 L
FEV1-Post: 1.59 L
FEV1FVC-%CHANGE-POST: -6 %
FEV1FVC-%PRED-PRE: 91 %
FEV6-%CHANGE-POST: -11 %
FEV6-%PRED-POST: 96 %
FEV6-%Pred-Pre: 109 %
FEV6-PRE: 2.68 L
FEV6-Post: 2.38 L
FEV6FVC-%CHANGE-POST: -2 %
FEV6FVC-%PRED-PRE: 103 %
FEV6FVC-%Pred-Post: 101 %
FVC-%Change-Post: -9 %
FVC-%Pred-Post: 95 %
FVC-%Pred-Pre: 105 %
FVC-Post: 2.46 L
POST FEV1/FVC RATIO: 65 %
PRE FEV1/FVC RATIO: 69 %
Post FEV6/FVC ratio: 97 %
Pre FEV6/FVC Ratio: 99 %

## 2015-12-08 MED ORDER — METHACHOLINE 4 MG/ML NEB SOLN
2.0000 mL | Freq: Once | RESPIRATORY_TRACT | Status: AC
  Administered 2015-12-08: 8 mg via RESPIRATORY_TRACT

## 2015-12-08 MED ORDER — METHACHOLINE 1 MG/ML NEB SOLN
2.0000 mL | Freq: Once | RESPIRATORY_TRACT | Status: AC
  Administered 2015-12-08: 2 mg via RESPIRATORY_TRACT

## 2015-12-08 MED ORDER — METHACHOLINE 16 MG/ML NEB SOLN
2.0000 mL | Freq: Once | RESPIRATORY_TRACT | Status: AC
  Administered 2015-12-08: 32 mg via RESPIRATORY_TRACT

## 2015-12-08 MED ORDER — METHACHOLINE 0.25 MG/ML NEB SOLN
2.0000 mL | Freq: Once | RESPIRATORY_TRACT | Status: AC
  Administered 2015-12-08: 0.5 mg via RESPIRATORY_TRACT

## 2015-12-08 MED ORDER — SODIUM CHLORIDE 0.9 % IN NEBU
3.0000 mL | INHALATION_SOLUTION | Freq: Once | RESPIRATORY_TRACT | Status: AC
  Administered 2015-12-08: 3 mL via RESPIRATORY_TRACT

## 2015-12-08 MED ORDER — ALBUTEROL SULFATE (2.5 MG/3ML) 0.083% IN NEBU
2.5000 mg | INHALATION_SOLUTION | Freq: Once | RESPIRATORY_TRACT | Status: AC
  Administered 2015-12-08: 2.5 mg via RESPIRATORY_TRACT

## 2015-12-08 MED ORDER — METHACHOLINE 0.0625 MG/ML NEB SOLN
2.0000 mL | Freq: Once | RESPIRATORY_TRACT | Status: AC
  Administered 2015-12-08: 0.125 mg via RESPIRATORY_TRACT

## 2015-12-12 ENCOUNTER — Encounter: Payer: Self-pay | Admitting: Pulmonary Disease

## 2015-12-13 NOTE — Telephone Encounter (Signed)
Dr. [name], please advise on PFT and MCT results, thanks!

## 2015-12-14 NOTE — Telephone Encounter (Signed)
Her CT does not show any lung abnormalities to explain the cough. There is coronary findings that may indicate heart disease. Please have her follow up with her PCP and consider seeing a cardiologist regarding this Methacholine challenge test does not show any evidence of asthma by my interpretation  Please have Dr. Berton Mount review the methacholine challenge part. Thanks

## 2015-12-15 NOTE — Progress Notes (Signed)
See 11.7.17 e-mail from pt

## 2015-12-18 NOTE — Telephone Encounter (Signed)
E-mail sent to patient with results Dr Annamaria Boots, per [facility] request would you mind viewing the methacholine challenge results and provide your interpretation?  Thank you.

## 2015-12-18 NOTE — Telephone Encounter (Signed)
Patient came in wanting breathing test results - pt would like  My chart message with this information -pr

## 2016-01-01 ENCOUNTER — Encounter: Payer: Self-pay | Admitting: Pulmonary Disease

## 2016-01-01 ENCOUNTER — Ambulatory Visit (INDEPENDENT_AMBULATORY_CARE_PROVIDER_SITE_OTHER): Payer: Medicare Other | Admitting: Pulmonary Disease

## 2016-01-01 VITALS — BP 116/80 | HR 56 | Ht 61.5 in | Wt 132.4 lb

## 2016-01-01 DIAGNOSIS — R059 Cough, unspecified: Secondary | ICD-10-CM

## 2016-01-01 DIAGNOSIS — R05 Cough: Secondary | ICD-10-CM | POA: Diagnosis not present

## 2016-01-01 NOTE — Progress Notes (Signed)
Misty Woods    [id number]    [birth year]  Primary Care Physician:Misty Woods [name], MD  Referring Physician: Lavone Orn, MD [place]. [employer] [address], [place] [address]  Chief complaint:  Follow up for chronic cough.  HPI: Mrs. Misty Woods is a 70 year old with history of chronic cough for the past 9 years. She was originally evaluated by Dr. Melvyn Woods in 2011 she was diagnosed with LPR, GERD and was placed on PPI twice a day and diet modification. This helped somewhat with her symptoms however for the past year she's noticed worsening cough. She was recently started on Flonase and Zantac was added to Protonix 40 mg twice a day. This has not changed his symptoms. We started her on chlorpheniramine, dymista, hycodan with no change in symptoms. She is not able to tolerate the chlorpheniramine as it makes her loopy. She is only taking this at 4 mg bid.   She has chronic daily hacking cough. This is worsened by cold air of water. She does not have any shortness of breath, wheezing. She states that her GERD symptoms are well controlled on the current antiacid therapy.  Interim history: Seen by Dr. Janace Woods, ENT who did a laryngoscopy. As per the patient he did not find anything abnormal. He may consider doing a CT scan of the sinuses. She has stopped the dymista nasal spray since she feels it does not help. She continues on the Zyrtec.   Outpatient Encounter Prescriptions as of 01/01/2016  Medication Sig  . calcium citrate-vitamin D (CITRACAL+D) 315-200 MG-UNIT tablet Take by mouth.  . chlorpheniramine (CHLOR-TRIMETON) 4 MG tablet Take 2 tablets 3 times daily (Patient taking differently: 4 mg. Takes 1 tab in the am and takes 1 tab at [employer])  . cyanocobalamin (TH VITAMIN B12) 100 MCG tablet Take by mouth.  . Fluticasone-Salmeterol (ADVAIR DISKUS) 250-50 MCG/DOSE AEPB Inhale 1 puff into the lungs 2 (two) times daily.  Marland Kitchen levothyroxine (SYNTHROID, LEVOTHROID) 100 MCG tablet Take 100  mcg by mouth daily before breakfast.  . omeprazole (PRILOSEC) 40 MG capsule   . ranitidine (ZANTAC) 150 MG tablet Take 150 mg by mouth 2 (two) times daily.  . [DISCONTINUED] levothyroxine (SYNTHROID, LEVOTHROID) 88 MCG tablet Take by mouth.   No facility-administered encounter medications on file as of 01/01/2016.     Allergies as of 01/01/2016 - Review Complete 01/01/2016  Allergen Reaction Noted  . Propoxyphene hcl    . Tramadol hcl    . Propoxyphene Rash 02/11/2012    No past medical history on file.  No past surgical history on file.  No family history on file.  Social History   Social History  . Marital status: Married    Spouse name: Misty Woods  . Number of children: Misty Woods  . Years of education: Misty Woods   Occupational History  . Not on file.   Social History Main Topics  . Smoking status: Former Smoker    Packs/day: 1.00    Years: 5.00    Types: Cigarettes    Quit date: 02/05/1968  . Smokeless tobacco: Never Used  . Alcohol use Yes     Comment: Social  . Drug use: No  . Sexual activity: Not on file   Other Topics Concern  . Not on file   Social History Narrative  . No narrative on file    Review of systems: Review of Systems  Constitutional: Negative for fever and chills.  HENT: Negative.   Eyes: Negative  for blurred vision.  Respiratory: as per HPI  Cardiovascular: Negative for chest pain and palpitations.  Gastrointestinal: Negative for vomiting, diarrhea, blood per rectum. Genitourinary: Negative for dysuria, urgency, frequency and hematuria.  Musculoskeletal: Negative for myalgias, back pain and joint pain.  Skin: Negative for itching and rash.  Neurological: Negative for dizziness, tremors, focal weakness, seizures and loss of consciousness.  Endo/Heme/Allergies: Negative for environmental allergies.  Psychiatric/Behavioral: Negative for depression, suicidal ideas and hallucinations.  All other systems reviewed and are negative.   Physical Exam: Blood  pressure 140/78, pulse 66, height 5' 1.5" (1.562 m), weight 133 lb 6.4 oz (60.5 kg), SpO2 97 %. Gen:      No acute distress HEENT:  EOMI, sclera anicteric, erythematous posterior pharynx Neck:     No masses; no thyromegaly Lungs:    Clear to auscultation bilaterally; normal respiratory effort CV:         Regular rate and rhythm; no murmurs Abd:      + bowel sounds; soft, non-tender; no palpable masses, no distension Ext:    No edema; adequate peripheral perfusion Skin:      Warm and dry; no rash Neuro: alert and oriented x 3 Psych: normal mood and affect  Data Reviewed: CXR 10/06/15- No acute abnormalities. Images reviewed. High res CT of chest 12/05/15-no evidence of interstitial lung disease. No pulmonary findings to explain patient's cough. Aortic atherosclerosis. Coronary artery calcifications.  All images reviewed.  FENO 10/25/15- 19  PFTs 12/11/15 FVC 2.72 (5%) FEV1 1.89 (97%) next and/F 69 Minimal obstructive lung disease. No broncho-dilator response. Methacholine challenge test negative.  Assessment:  Chronic cough Likely upper airway cough syndrome driven predominantly by GERD. Her symptoms have not responded to maximal acid suppression, antihistamine with chlorpheniramine and dymista. She is not tolerant of chlorpheniramine as it causes dizziness so this was switched to zyrtec. She has also stopped the dymista as it is not helping.   The symptoms are not very typical of asthma and FENO is low. Even so we tried pred taper and advair 250/50 with minimal improvement in symptoms. CT scan of the chest is normal and PFTs do not show any response to methacholine challenge. ENT eval was unremarkable. So far we have eliminated all upper airway and lung etiologies of cough. She will likely needs to be seen by GI for her persistent GERD symptoms. She'll discuss getting a referral back to GI when she meets her primary care physician Dr. Laurann Woods. She also check with him for the coronary artery  calcifications for further follow-up as needed.  I re educated her on behavioral changes to deal with cough including conscious suppression of the urge to cough, use of throat lozenges.  Plan/Recommend ations: - Continue Zyrtec10 mg daily - Continue protonix and zantac - Continue advair - Consider GI referral  [name] MD [facility] Pager [phone] If no answer or after 3pm call: [phone] 01/01/2016, 12:14 PM  CC: Misty Orn, MD

## 2016-01-01 NOTE — Patient Instructions (Signed)
Continue using the Advair as prescribed. Please follow up with Dr. Laurann Montana for evaluation of the coronary calcifications and possible GI referral for GERD.  Follow up in 3 months.

## 2016-01-04 DIAGNOSIS — R05 Cough: Secondary | ICD-10-CM | POA: Diagnosis not present

## 2016-01-04 DIAGNOSIS — E7800 Pure hypercholesterolemia, unspecified: Secondary | ICD-10-CM | POA: Diagnosis not present

## 2016-01-04 DIAGNOSIS — Z0000 Encounter for general adult medical examination without abnormal findings: Secondary | ICD-10-CM | POA: Diagnosis not present

## 2016-01-04 DIAGNOSIS — E890 Postprocedural hypothyroidism: Secondary | ICD-10-CM | POA: Diagnosis not present

## 2016-01-04 DIAGNOSIS — Z1389 Encounter for screening for other disorder: Secondary | ICD-10-CM | POA: Diagnosis not present

## 2016-01-24 DIAGNOSIS — Z8371 Family history of colonic polyps: Secondary | ICD-10-CM | POA: Diagnosis not present

## 2016-01-24 DIAGNOSIS — R05 Cough: Secondary | ICD-10-CM | POA: Diagnosis not present

## 2016-02-29 DIAGNOSIS — H2513 Age-related nuclear cataract, bilateral: Secondary | ICD-10-CM | POA: Diagnosis not present

## 2016-02-29 DIAGNOSIS — H524 Presbyopia: Secondary | ICD-10-CM | POA: Diagnosis not present

## 2016-03-19 DIAGNOSIS — Z1231 Encounter for screening mammogram for malignant neoplasm of breast: Secondary | ICD-10-CM | POA: Diagnosis not present

## 2016-03-21 DIAGNOSIS — R05 Cough: Secondary | ICD-10-CM | POA: Diagnosis not present

## 2016-03-21 DIAGNOSIS — Z8371 Family history of colonic polyps: Secondary | ICD-10-CM | POA: Diagnosis not present

## 2016-04-01 DIAGNOSIS — K317 Polyp of stomach and duodenum: Secondary | ICD-10-CM | POA: Diagnosis not present

## 2016-04-01 DIAGNOSIS — R05 Cough: Secondary | ICD-10-CM | POA: Diagnosis not present

## 2016-04-02 ENCOUNTER — Ambulatory Visit: Payer: Medicare Other | Admitting: Pulmonary Disease

## 2016-04-03 ENCOUNTER — Ambulatory Visit (INDEPENDENT_AMBULATORY_CARE_PROVIDER_SITE_OTHER): Payer: Medicare Other | Admitting: Pulmonary Disease

## 2016-04-03 ENCOUNTER — Encounter: Payer: Self-pay | Admitting: Pulmonary Disease

## 2016-04-03 VITALS — BP 116/80 | HR 60 | Ht 61.5 in | Wt 134.0 lb

## 2016-04-03 DIAGNOSIS — R059 Cough, unspecified: Secondary | ICD-10-CM

## 2016-04-03 DIAGNOSIS — R05 Cough: Secondary | ICD-10-CM | POA: Diagnosis not present

## 2016-04-03 NOTE — Progress Notes (Signed)
Misty Woods    [id number]    1945/06/19  Primary Care Physician:Misty Woods [name], MD  Referring Physician: Lavone Orn, MD [place]. [address] [employer] [address], [place] [address]  Chief complaint:  Follow up for chronic cough.  HPI: Misty Woods is a 71 year old with history of chronic cough for the past 9 years. She was originally evaluated by Misty Woods in 2011 she was diagnosed with LPR, GERD and was placed on PPI twice a day and diet modification. This helped somewhat with her symptoms however for the past year she's noticed worsening cough. She was recently started on Flonase and Zantac was added to Protonix 40 mg twice a day. This has not changed his symptoms. We started her on chlorpheniramine, dymista, hycodan with no change in symptoms. She is not able to tolerate the chlorpheniramine as it makes her loopy. She is only taking this at 4 mg bid.   She has   chronic daily hacking cough. This is worsened by cold air of water. She does not have any shortness of breath, wheezing. She states that her GERD symptoms are well controlled on the current antiacid therapy.  Seen by Misty Woods, ENT who did a laryngoscopy. As per the patient he did not find anything abnormal. He may consider doing a CT scan of the sinuses. She has stopped the dymista nasal spray since she feels it does not help. She continues on the Zyrtec.  Interim history: Her cough continues unchanged. She was seen by GI and had a scope which showed multiple gastric polyps. Biopsies are still pending. She was told to stop the PPI. There is no evidence of esophagitis or reflux.   Outpatient Encounter Prescriptions as of 04/03/2016  Medication Sig  . calcium citrate-vitamin D (CITRACAL+D) 315-200 MG-UNIT tablet Take by mouth.  . cyanocobalamin (TH VITAMIN B12) 100 MCG tablet Take by mouth.  . levothyroxine (SYNTHROID, LEVOTHROID) 100 MCG tablet Take 100 mcg by mouth daily before breakfast.  . omeprazole (PRILOSEC) 40  MG capsule   . [DISCONTINUED] chlorpheniramine (CHLOR-TRIMETON) 4 MG tablet Take 2 tablets 3 times daily (Patient taking differently: 4 mg. Takes 1 tab in the am and takes 1 tab at [employer])  . [DISCONTINUED] Fluticasone-Salmeterol (ADVAIR DISKUS) 250-50 MCG/DOSE AEPB Inhale 1 puff into the lungs 2 (two) times daily.  . [DISCONTINUED] ranitidine (ZANTAC) 150 MG tablet Take 150 mg by mouth 2 (two) times daily.   No facility-administered encounter medications on file as of 04/03/2016.     Allergies as of 04/03/2016 - Review Complete 04/03/2016  Allergen Reaction Noted  . Propoxyphene hcl    . Tramadol hcl    . Propoxyphene Rash 02/11/2012    No past medical history on file.  No past surgical history on file.  No family history on file.  Social History   Social History  . Marital status: Married    Spouse name: N/A  . Number of children: N/A  . Years of education: N/A   Occupational History  . Not on file.   Social History Main Topics  . Smoking status: Former Smoker    Packs/day: 1.00    Years: 5.00    Types: Cigarettes    Quit date: 02/05/1968  . Smokeless tobacco: Never Used  . Alcohol use Yes     Comment: Social  . Drug use: No  . Sexual activity: Not on file   Other Topics Concern  . Not on file   Social  History Narrative  . No narrative on file    Review of systems: Review of Systems  Constitutional: Negative for fever and chills.  HENT: Negative.   Eyes: Negative for blurred vision.  Respiratory: as per HPI  Cardiovascular: Negative for chest pain and palpitations.  Gastrointestinal: Negative for vomiting, diarrhea, blood per rectum. Genitourinary: Negative for dysuria, urgency, frequency and hematuria.  Musculoskeletal: Negative for myalgias, back pain and joint pain.  Skin: Negative for itching and rash.  Neurological: Negative for dizziness, tremors, focal weakness, seizures and loss of consciousness.  Endo/Heme/Allergies: Negative for environmental  allergies.  Psychiatric/Behavioral: Negative for depression, suicidal ideas and hallucinations.  All other systems reviewed and are negative.   Physical Exam: Blood pressure 116/80, pulse 60, height 5' 1.5" (1.562 m), weight 60.8 kg (134 lb), SpO2 99 %. Gen:      No acute distress HEENT:  EOMI, sclera anicteric Neck:     No masses; no thyromegaly Lungs:    Clear to auscultation bilaterally; normal respiratory effort CV:         Regular rate and rhythm; no murmurs Abd:      + bowel sounds; soft, non-tender; no palpable masses, no distension Ext:    No edema; adequate peripheral perfusion Skin:      Warm and dry; no rash Neuro: alert and oriented x 3 Psych: normal mood and affect  Data Reviewed: CXR 10/06/15- No acute abnormalities. Images reviewed. High res CT of chest 12/05/15-no evidence of interstitial lung disease. No pulmonary findings to explain patient's cough. Aortic atherosclerosis. Coronary artery calcifications.  I have reviewed all images personally  FENO 10/25/15- 19  PFTs 12/11/15 FVC 2.72 (5%) FEV1 1.89 (97%) next and/F 69 Minimal obstructive lung disease. No broncho-dilator response. Methacholine challenge test negative.  Assessment:  Refractory chronic cough Her cough has been refractory to multiple interventions including maximal treatment for postnasal drip, GERD.  She was evaluated by ENT and no abnormalities were found. She had a GI evaluation with no evidence of acid reflux. Noted to have multiple gastric polyps.  The symptoms are not very typical of asthma and FENO is low. Even so we tried pred taper and advair 250/50 with minimal improvement in symptoms. CT scan of the chest is normal and PFTs do not show any response to methacholine challenge. So far we have eliminated all upper airway, GI and obvious lung etiologies of cough.   I'll do a bronchoscopy for airway inspection to make sure she doesn't have any endobronchial lesions she may benefit from going on  Neurontin for refractory cough but she would like to do the bronchoscopy first. I have discussed the risk benefits of the procedure and she wants to proceed.  I re educated her on behavioral changes to deal with cough including conscious suppression of the urge to cough, use of throat lozenges.  Plan/Recommend ations: - Schedule for bronchoscopy  [name] MD [facility] Pager [phone] If no answer or after 3pm call: [phone] 04/03/2016, 12:27 PM  CC: Misty Orn, MD

## 2016-04-03 NOTE — Patient Instructions (Addendum)
We will schedule you for a bronchoscopy for airway inspection. Someone weill get in touch with you with the date of procedure. Its OK to stop the protonix  Return to clinic in 3 months.

## 2016-04-04 DIAGNOSIS — K317 Polyp of stomach and duodenum: Secondary | ICD-10-CM | POA: Diagnosis not present

## 2016-04-11 ENCOUNTER — Encounter (HOSPITAL_COMMUNITY): Payer: Self-pay

## 2016-04-11 ENCOUNTER — Ambulatory Visit (HOSPITAL_COMMUNITY)
Admission: RE | Admit: 2016-04-11 | Discharge: 2016-04-11 | Disposition: A | Discharge status: Home / Self Care | Payer: Medicare Other | Source: Ambulatory Visit | Attending: Pulmonary Disease | Admitting: Pulmonary Disease

## 2016-04-11 ENCOUNTER — Ambulatory Visit (HOSPITAL_COMMUNITY): Payer: Medicare Other

## 2016-04-11 ENCOUNTER — Telehealth: Payer: Self-pay | Admitting: Pulmonary Disease

## 2016-04-11 ENCOUNTER — Encounter (HOSPITAL_COMMUNITY)
Admission: RE | Disposition: A | Discharge status: Home / Self Care | Payer: Self-pay | Source: Ambulatory Visit | Attending: Pulmonary Disease

## 2016-04-11 DIAGNOSIS — K219 Gastro-esophageal reflux disease without esophagitis: Secondary | ICD-10-CM | POA: Insufficient documentation

## 2016-04-11 DIAGNOSIS — R05 Cough: Secondary | ICD-10-CM | POA: Insufficient documentation

## 2016-04-11 DIAGNOSIS — Z87891 Personal history of nicotine dependence: Secondary | ICD-10-CM | POA: Diagnosis not present

## 2016-04-11 DIAGNOSIS — Z79899 Other long term (current) drug therapy: Secondary | ICD-10-CM | POA: Insufficient documentation

## 2016-04-11 DIAGNOSIS — Z9889 Other specified postprocedural states: Secondary | ICD-10-CM

## 2016-04-11 HISTORY — PX: VIDEO BRONCHOSCOPY: SHX5072

## 2016-04-11 SURGERY — VIDEO BRONCHOSCOPY WITHOUT FLUORO
Anesthesia: Moderate Sedation | Laterality: Bilateral

## 2016-04-11 MED ORDER — MIDAZOLAM HCL 5 MG/ML IJ SOLN
INTRAMUSCULAR | Status: AC
  Filled 2016-04-11: qty 2

## 2016-04-11 MED ORDER — MIDAZOLAM HCL 10 MG/2ML IJ SOLN
INTRAMUSCULAR | Status: DC | PRN
  Administered 2016-04-11 (×4): 1 mg via INTRAVENOUS

## 2016-04-11 MED ORDER — LIDOCAINE HCL 2 % EX GEL
CUTANEOUS | Status: DC | PRN
  Administered 2016-04-11: 1

## 2016-04-11 MED ORDER — LIDOCAINE HCL (PF) 1 % IJ SOLN
INTRAMUSCULAR | Status: DC | PRN
  Administered 2016-04-11: 6 mL

## 2016-04-11 MED ORDER — PHENYLEPHRINE HCL 0.25 % NA SOLN
NASAL | Status: DC | PRN
  Administered 2016-04-11: 2 via NASAL

## 2016-04-11 MED ORDER — FENTANYL CITRATE (PF) 100 MCG/2ML IJ SOLN
INTRAMUSCULAR | Status: DC | PRN
  Administered 2016-04-11 (×4): 25 ug via INTRAVENOUS

## 2016-04-11 MED ORDER — SODIUM CHLORIDE 0.9 % IV SOLN
Freq: Once | INTRAVENOUS | Status: AC
  Administered 2016-04-11: 10:00:00 via INTRAVENOUS

## 2016-04-11 MED ORDER — FENTANYL CITRATE (PF) 100 MCG/2ML IJ SOLN
INTRAMUSCULAR | Status: AC
  Filled 2016-04-11: qty 4

## 2016-04-11 NOTE — Telephone Encounter (Signed)
Yes. Please send in a prescription for gabapentin PO 300 mg once daily. Dispense 30 with 3 refills.  PM

## 2016-04-11 NOTE — Op Note (Signed)
[facility] Cardiopulmonary Patient Name: Misty Woods Date: 04/11/2016 MRN: [id number] Attending MD: [name] , MD Date of Birth: 1945-05-19 CSN: Finalized Age: 71 Admit Type: Outpatient Gender: Female Procedure:            Bronchoscopy Indications:          Chronic cough with normal CT Providers:            [name], MD, Cherre Huger RRT, RCP, Phillis Knack                        RRT, RCP Referring MD:          Medicines:            Midazolam 3 mg mg IV, Fentanyl 75 mcg IV Complications:        No immediate complications Estimated Blood Loss: Estimated blood loss: none. Procedure:            Pre-Anesthesia Assessment:                       - A History and Physical has been performed. Patient                        meds and allergies have been reviewed. The risks and                        benefits of the procedure and the sedation options and                        risks were discussed with the patient. All questions                        were answered and informed consent was obtained.                        Patient identification and proposed procedure were                        verified prior to the procedure by the physician.                        Mental Status Examination: alert and oriented. Airway                        Examination: normal oropharyngeal airway. Respiratory                        Examination: clear to auscultation. CV Examination:                        RRR, no murmurs, no S3 or S4. ASA Grade Assessment: I -                        A normal healthy patient. After reviewing the risks and                        benefits, the patient was deemed in satisfactory                        condition to undergo the procedure. The  anesthesia plan                        was to use moderate sedation / analgesia (conscious                        sedation). Immediately prior to administration of                        medications, the  patient was re-assessed for adequacy                        to receive sedatives. The heart rate, respiratory rate,                        oxygen saturations, blood pressure, adequacy of                        pulmonary ventilation, and response to care were                        monitored throughout the procedure. The physical status                        of the patient was re-assessed after the procedure.                       After obtaining informed consent, the bronchoscope was                        passed under direct vision. Throughout the procedure,                        the patient's blood pressure, pulse, and oxygen                        saturations were monitored continuously. the [id number]                        [id number] scope was introduced through the left nostril                        and advanced to the tracheobronchial tree. The                        procedure was accomplished with ease. The patient                        tolerated the procedure well. The total duration of the                        procedure was 9 hours and 10 minutes. Scope In: 10:22:59 AM Scope Out: 10:29:35 AM Findings:      The nasopharynx/oropharynx appears normal. The larynx appears normal.       The vocal cords appear normal. The subglottic space is normal. The       trachea is of normal caliber. The carina is sharp. The tracheobronchial       tree was examined to at least the first subsegmental level. Bronchial       mucosa and anatomy are normal; there  are no endobronchial lesions, and       no secretions.      Normal larynx. The vocal cords appear normal.      The nasopharynx/oropharynx appears normal. The larynx appears normal.       The vocal cords appear normal. The subglottic space is normal. The       trachea is of normal caliber. The carina is sharp. The tracheobronchial       tree was examined to at least the first subsegmental level. Bronchial       mucosa and anatomy are normal;  there are no endobronchial lesions, and       no secretions. Impression:           - Chronic cough with normal CT                       - The examination was normal.                       - No specimens collected.                       - The examination was normal. Moderate Sedation:      Moderate (conscious) sedation was administered by the endoscopy nurse       and supervised by the endoscopist. The following parameters were       monitored: oxygen saturation, heart rate, blood pressure, respiratory       rate, EKG, adequacy of pulmonary ventilation, and response to care. Recommendation:       - Follow up in clinic as previously scheduled. Procedure Code(s):    --- Professional ---                       [phone], Bronchoscopy, rigid or flexible, including                        fluoroscopic guidance, when performed; diagnostic, with                        cell washing, when performed (separate procedure) Diagnosis Code(s):    --- Professional ---                       R05, Cough CPT copyright 2016 American Medical Association. All rights reserved. The codes documented in this report are preliminary and upon coder review may  be revised to meet current compliance requirements. [name], MD 04/11/2016 10:37:37 AM Number of Addenda: 0

## 2016-04-11 NOTE — Progress Notes (Signed)
Video bronchoscopy performed. No intervention done. Pt tolerated well

## 2016-04-11 NOTE — Discharge Instructions (Signed)
Flexible Bronchoscopy, Care After These instructions give you information on caring for yourself after your procedure. Your doctor may also give you more specific instructions. Call your doctor if you have any problems or questions after your procedure. Follow these instructions at home:  Do not eat or drink anything for 2 hours after your procedure. If you try to eat or drink before the medicine wears off, food or drink could go into your lungs. You could also burn yourself.  After 2 hours have passed and when you can cough and gag normally, you may eat soft food and drink liquids slowly.  The day after the test, you may eat your normal diet.  You may do your normal activities.  Keep all doctor visits. Get help right away if:  You get more and more short of breath.  You get light-headed.  You feel like you are going to pass out (faint).  You have chest pain.  You have new problems that worry you.  You cough up more than a little blood.  You cough up more blood than before. This information is not intended to replace advice given to you by your health care provider. Make sure you discuss any questions you have with your health care provider. Document Released: 11/18/2008 Document Revised: 06/29/2015 Document Reviewed: 09/25/2012 Elsevier Interactive Patient Education  2017 [place] not eat or drink until on 3/8/2018at 1230

## 2016-04-11 NOTE — Telephone Encounter (Signed)
Pt is requesting a lower dose to start out with. Pt is requesting Gabapentin 100mg  and 90-day supply. Please advise Dr [name] if okay to change dose and please advise on instructions.

## 2016-04-11 NOTE — Telephone Encounter (Signed)
PM---  Pt stated that you were going to send in refill to [facility] for the gabapentin.  I do not see this in your last note---please advise if ok to send in refill. Thanks

## 2016-04-12 MED ORDER — GABAPENTIN 100 MG PO CAPS: 100.0000 mg | ORAL_CAPSULE | Freq: Every day | ORAL | 1 refills | Status: DC

## 2016-04-12 NOTE — Telephone Encounter (Signed)
Patients husband is returning phone call  

## 2016-04-12 NOTE — Telephone Encounter (Signed)
lmtcb x1 for pt. 

## 2016-04-12 NOTE — Telephone Encounter (Signed)
Spoke with pt's husband (dpr on file), aware of dosage change. rx sent to preferred pharmacy.  Nothing further needed.

## 2016-04-12 NOTE — Telephone Encounter (Signed)
That is fine to order lower dose gabapentin 100 mg daily, 90 day supply  PM

## 2016-04-14 ENCOUNTER — Encounter (HOSPITAL_COMMUNITY): Payer: Self-pay | Admitting: Pulmonary Disease

## 2016-05-20 ENCOUNTER — Encounter: Payer: Self-pay | Admitting: Pulmonary Disease

## 2016-05-20 NOTE — Telephone Encounter (Signed)
Dr. [name]   Please Advise-  Please see pt email

## 2016-05-21 NOTE — Telephone Encounter (Signed)
Pt has seen GI and had endoscopy done.  Pt is interested in moving forward with the refractory clinical trial mentioned to her.  Please also advise on increasing Gabapentin 300mg  per patient request for the increased cough. Please advise Dr [name]. Thanks.

## 2016-05-22 ENCOUNTER — Encounter: Payer: Self-pay | Admitting: Pulmonary Disease

## 2016-05-22 MED ORDER — GABAPENTIN 100 MG PO CAPS: 100.0000 mg | ORAL_CAPSULE | Freq: Every day | ORAL | 1 refills | Status: DC

## 2016-06-10 ENCOUNTER — Encounter: Payer: Self-pay | Admitting: Pulmonary Disease

## 2016-06-11 ENCOUNTER — Telehealth: Payer: Self-pay | Admitting: Pulmonary Disease

## 2016-06-11 ENCOUNTER — Other Ambulatory Visit: Payer: Self-pay

## 2016-06-11 MED ORDER — GABAPENTIN 300 MG PO CAPS: 300.0000 mg | ORAL_CAPSULE | Freq: Every day | ORAL | 0 refills | Status: DC

## 2016-06-11 MED ORDER — GABAPENTIN 300 MG PO CAPS: 300.0000 mg | ORAL_CAPSULE | Freq: Three times a day (TID) | ORAL | 3 refills | Status: DC

## 2016-06-11 NOTE — Telephone Encounter (Signed)
rx sent to preferred pharmacy.  Pt aware.  Nothing further needed.  

## 2016-06-21 ENCOUNTER — Encounter: Payer: Self-pay | Admitting: Allergy and Immunology

## 2016-06-21 ENCOUNTER — Ambulatory Visit (INDEPENDENT_AMBULATORY_CARE_PROVIDER_SITE_OTHER): Payer: Medicare Other | Admitting: Allergy and Immunology

## 2016-06-21 VITALS — BP 112/70 | HR 57 | Temp 97.3°F | Ht 61.5 in | Wt 131.6 lb

## 2016-06-21 DIAGNOSIS — J310 Chronic rhinitis: Secondary | ICD-10-CM | POA: Diagnosis not present

## 2016-06-21 DIAGNOSIS — R05 Cough: Secondary | ICD-10-CM

## 2016-06-21 DIAGNOSIS — R059 Cough, unspecified: Secondary | ICD-10-CM

## 2016-06-21 MED ORDER — FLUTTER DEVI: 1 refills | Status: DC

## 2016-06-21 NOTE — Progress Notes (Signed)
New Patient Note  RE: Misty Woods MRN: [id number] DOB: 1946/01/04 Date of Office Visit: 06/21/2016  Referring provider: Lavone Orn, MD Primary care provider: Lavone Orn, MD  Chief Complaint: New Evaluation (C/O chronic cough. )   History of present illness: Misty Woods is a 71 y.o. female seen today in consultation requested by Lavone Orn, MD.  She reports that she has had a persistent cough which started approximately 10 years ago and has progressed.  The cough is nonproductive and feels like it originates from the upper chest.  There is no diurnal pattern to the cough.  She occasionally experiences mild postnasal drainage and slight nasal congestion, however she states that she coughs even when she does not have postnasal drainage or congestion.  She has been evaluated by a gastroenterologist with EGD and inflammation secondary to reflux has been ruled out.  She has been evaluated by an otolaryngologist and no abnormalities were found.  She is being followed by pulmonologist and, based upon negative evaluation thus far, she is being treated for refractory neurogenic cough with gabapentin.  Unfortunately, the gabapentin does not seem to be providing significant relief.  Approximately 10 years ago, at around the same time the cough started, she acquired a Abanda which has been in the home ever since. She had had birds in the home previously without perceived health issues.    Assessment and plan: COUGH The most common causes of chronic cough include the following: upper airway cough syndrome (UACS) which is caused by variety of rhinosinus conditions; asthma; gastroesophageal reflux disease (GERD); chronic bronchitis from cigarette smoking or other inhaled environmental irritants; non-asthmatic eosinophilic bronchitis; and bronchiectasis. In prospective studies, these conditions have accounted for up to 94% of the causes of chronic cough in immunocompetent adults.  Postnasal  drainage does not seem to be an issue for Misty Woods.  Gastroesophageal reflux has been ruled out by EGD.  Spirometry today revealed an FEV1 of 81% predicted with slight post bronchodilator reversibility which does not meet ATS criteria for the diagnosis of asthma.  She had bronchoscopy but I have not been able to access the results. The history reveals that the cough started approximately the same time that she acquired a Development worker, international aid. We will evaluate for hypersensitivity pneumonitis.   A prescription has been provided for a flutter valve to be used as needed to break the coughing cycle.  Laboratory were form has been provided for serum precipitins (IgG antibodies) hypersensitivity pneumonitis panel.  In addition, I have asked Misty Woods to take a bird feather and droppings to the lab to be added to the panel.  As serum precipitins have a high false negative rate, if precipitins are negative removing the parrot from the environment for at least 6-8 weeks while monitoring for symptom reduction may be beneficial.    Meds ordered this encounter  Medications  . Respiratory Therapy Supplies (FLUTTER) DEVI    Sig: Use as directed.    Dispense:  1 each    Refill:  1    Diagnostics: Spirometry: Spirometry reveals an FVC of 2.22 L (89% predicted) and FEV1 of 1.60 L (81% predicted) with 100 mL (7%) post bronchodilator improvement.   Please see scanned spirometry results for details. Epicutaneous testing:  Negative despite a positive histamine control. Intradermal testing:  Negative despite a positive histamine control.    Physical examination: Blood pressure 112/70, pulse (!) 57, temperature 97.3 F (36.3 C), temperature source Oral, height 5' 1.5" (1.562 m),  weight 131 lb 9.6 oz (59.7 kg), SpO2 99 %.  General: Alert, interactive, in no acute distress. HEENT: TMs pearly gray, turbinates minimally edematous without discharge, post-pharynx unremarkable. Neck: Supple without  lymphadenopathy. Lungs: Clear to auscultation without wheezing, rhonchi or rales. CV: Normal S1, S2 without murmurs. Abdomen: Nondistended, nontender. Skin: Warm and dry, without lesions or rashes. Extremities:  No clubbing, cyanosis or edema. Neuro:   Grossly intact.  Review of systems:  Review of systems negative except as noted in HPI / PMHx or noted below: Review of Systems  Constitutional: Negative.   HENT: Negative.   Eyes: Negative.   Respiratory: Negative.   Cardiovascular: Negative.   Gastrointestinal: Negative.   Genitourinary: Negative.   Musculoskeletal: Negative.   Skin: Negative.   Neurological: Negative.   Endo/Heme/Allergies: Negative.   Psychiatric/Behavioral: Negative.     Past medical history:  History reviewed. No pertinent past medical history.  Past surgical history:  Past Surgical History:  Procedure Laterality Date  . VIDEO BRONCHOSCOPY Bilateral 04/11/2016   Procedure: VIDEO BRONCHOSCOPY WITHOUT FLUORO;  Surgeon: [name], MD;  Location: [facility];  Service: Cardiopulmonary;  Laterality: Bilateral;    Family history:  Family History  Problem Relation Age of Onset  . Allergic rhinitis Brother   . Asthma Brother   . Angioedema Neg Hx   . Atopy Neg Hx   . Eczema Neg Hx   . Immunodeficiency Neg Hx     Social history: Social History   Social History  . Marital status: Married    Spouse name: N/A  . Number of children: N/A  . Years of education: N/A   Occupational History  . Not on file.   Social History Main Topics  . Smoking status: Former Smoker    Packs/day: 1.00    Years: 5.00    Types: Cigarettes    Quit date: 02/05/1968  . Smokeless tobacco: Never Used  . Alcohol use Yes     Comment: Social  . Drug use: No  . Sexual activity: Not on file   Other Topics Concern  . Not on file   Social History Narrative  . No narrative on file   Environmental History: The patient lives in a 71 year old house with carpeting  throughout, gas heat, and central air.  There is no known mold/water damage in the home.  She is a former cigarette smoker having smoked from 1965-1970, one pack per day on average.  There is a bird Development worker, international aid) in the home for the past 10 years.  Allergies as of 06/21/2016      Reactions   Propoxyphene Hcl    REACTION: rash   Tramadol Hcl    REACTION: dizziness   Propoxyphene Rash      Medication List       Accurate as of 06/21/16 11:59 PM. Always use your most recent med list.          calcium citrate-vitamin D 315-200 MG-UNIT tablet Commonly known as:  CITRACAL+D Take by mouth.   FLUTTER Devi Use as directed.   gabapentin 300 MG capsule Commonly known as:  NEURONTIN Take 1 capsule (300 mg total) by mouth daily.   levothyroxine 100 MCG tablet Commonly known as:  SYNTHROID, LEVOTHROID Take 100 mcg by mouth daily before breakfast.   TH VITAMIN B12 100 MCG tablet Generic drug:  cyanocobalamin Take by mouth.       Known medication allergies: Allergies  Allergen Reactions  . Propoxyphene Hcl     REACTION: rash  .  Tramadol Hcl     REACTION: dizziness  . Propoxyphene Rash    I appreciate the opportunity to take part in Misty Woods's care. Please do not hesitate to contact me with questions.  Sincerely,   R. Edgar Frisk, MD

## 2016-06-21 NOTE — Assessment & Plan Note (Addendum)
The most common causes of chronic cough include the following: upper airway cough syndrome (UACS) which is caused by variety of rhinosinus conditions; asthma; gastroesophageal reflux disease (GERD); chronic bronchitis from cigarette smoking or other inhaled environmental irritants; non-asthmatic eosinophilic bronchitis; and bronchiectasis. In prospective studies, these conditions have accounted for up to 94% of the causes of chronic cough in immunocompetent adults.  Postnasal drainage does not seem to be an issue for Misty Woods.  Gastroesophageal reflux has been ruled out by EGD.  Spirometry today revealed an FEV1 of 81% predicted with slight post bronchodilator reversibility which does not meet ATS criteria for the diagnosis of asthma.  She had bronchoscopy but I have not been able to access the results. The history reveals that the cough started approximately the same time that she acquired a Development worker, international aid. We will evaluate for hypersensitivity pneumonitis.   A prescription has been provided for a flutter valve to be used as needed to break the coughing cycle.  Laboratory were form has been provided for serum precipitins (IgG antibodies) hypersensitivity pneumonitis panel.  In addition, I have asked Misty Woods to take a bird feather and droppings to the lab to be added to the panel.  As serum precipitins have a high false negative rate, if precipitins are negative removing the parrot from the environment for at least 6-8 weeks while monitoring for symptom reduction may be beneficial.

## 2016-06-24 NOTE — Patient Instructions (Signed)
COUGH The most common causes of chronic cough include the following: upper airway cough syndrome (UACS) which is caused by variety of rhinosinus conditions; asthma; gastroesophageal reflux disease (GERD); chronic bronchitis from cigarette smoking or other inhaled environmental irritants; non-asthmatic eosinophilic bronchitis; and bronchiectasis. In prospective studies, these conditions have accounted for up to 94% of the causes of chronic cough in immunocompetent adults.  Postnasal drainage does not seem to be an issue for Misty Woods.  Gastroesophageal reflux has been ruled out by EGD.  Spirometry today revealed an FEV1 of 81% predicted with slight post bronchodilator reversibility which does not meet ATS criteria for the diagnosis of asthma.  She had bronchoscopy but I have not been able to access the results. The history reveals that the cough started approximately the same time that she acquired a Development worker, international aid. We will evaluate for hypersensitivity pneumonitis.   A prescription has been provided for a flutter valve to be used as needed to break the coughing cycle.  Laboratory were form has been provided for serum precipitins (IgG antibodies) hypersensitivity pneumonitis panel.  In addition, I have asked [name] to take a bird feather and droppings to the lab to be added to the panel.  As serum precipitins have a high false negative rate, if precipitins are negative removing the parrot from the environment for at least 6-8 weeks while monitoring for symptom reduction may be beneficial.    When lab results have returned the patient will be called with further recommendations and follow up instructions.

## 2016-06-30 ENCOUNTER — Encounter: Payer: Self-pay | Admitting: Pulmonary Disease

## 2016-07-02 MED ORDER — GABAPENTIN 300 MG PO CAPS: 300.0000 mg | ORAL_CAPSULE | Freq: Every day | ORAL | 1 refills | Status: DC

## 2016-07-02 NOTE — Telephone Encounter (Signed)
Called [employer] spoke with [facility], gave Gabapentin clarification.  On 04/11/16 our office sent an Rx for Gabapentin 100mg  tabs #90. Per the notes from March, we started the patient on 100mg  tablets to start out and work up to 300mg  tabs.  Pt requested a Rx for the 300mg  tablets around 06/11/16 to be refilled to [facility] and they are now needing clarification of which dose she is supposed to be taking.   Varney Daily, CMA  Note  04/11/16  3:39 PM  Pt is requesting a lower dose to start out with. Pt is requesting Gabapentin 100mg  and 90-day supply. Please advise Dr [name] if okay to change dose and please advise on instructions.      Per 05/20/16 e-mail, Gabapentin dose was increased back to 300mg  d/t her cough increasing. (see notes)  Rx clarification given to [place] - spoke with Maudie Mercury (Pharmacist) - verbal given for Gabapentin 300mg , 1 po qd, #90 x 1 refill.  Replied to patient via e-mail making aware that this has been taken care of. Nothing further needed.

## 2016-07-08 ENCOUNTER — Ambulatory Visit: Payer: Self-pay | Admitting: Allergy and Immunology

## 2016-07-10 ENCOUNTER — Telehealth: Payer: Self-pay

## 2016-07-10 ENCOUNTER — Encounter: Payer: Self-pay | Admitting: Pulmonary Disease

## 2016-07-10 ENCOUNTER — Ambulatory Visit (INDEPENDENT_AMBULATORY_CARE_PROVIDER_SITE_OTHER): Payer: Medicare Other | Admitting: Pulmonary Disease

## 2016-07-10 VITALS — BP 124/72 | HR 65 | Ht 61.5 in | Wt 130.8 lb

## 2016-07-10 DIAGNOSIS — R05 Cough: Secondary | ICD-10-CM | POA: Diagnosis not present

## 2016-07-10 DIAGNOSIS — R059 Cough, unspecified: Secondary | ICD-10-CM

## 2016-07-10 MED ORDER — FLUTTER DEVI: 0 refills | Status: DC

## 2016-07-10 MED ORDER — GABAPENTIN 300 MG PO CAPS: 300.0000 mg | ORAL_CAPSULE | Freq: Two times a day (BID) | ORAL | 2 refills | Status: DC

## 2016-07-10 NOTE — Patient Instructions (Addendum)
We will increase the gabapentin to 300 mg bid Please follow up with the GI doctor and the allergy doctor for evaluation of hypersensitvity to birds Follow up in 3 months   I have discussed a couple of clinical trials for cough and am giving more information. Please review the information regarding this.  Title: A Phase 3, Randomized, Double-Blind, Placebo-Controlled, 18-Month Study to Evaluate the Efficacy and Safety of MK-7264 in Adult Participants with Chronic Cough ([id number]); [id number].   Investigational Product: [id number], a P2X3 receptor antagonist, has been evaluated in clinical studies for the treatment of chronic cough.  OZ-3664 is an oral treatment provided as a film coated tablet. The QI-3474 tablets provided for this study contain either MK-7264 45 mg or 15 mg. The placebo tablets contain no MK-7264, but contain the same inactive excipients as those included in the active tablets.   Key Inclusion Criteria: Adult patients with chronic cough  Key Exclusion Criteria: Current or recent smoker, requiring medication for chronic cough, eGFR<51m/min/1.4m, allergy to sulfonamides, pregnancy/lactation, prior exposure to MQV9563(formerly called AF219), history of malignancy, history of uncontrolled hypertension.   Other Key Data:  -In the completed and ongoing clinical studies, no major safety concerns have been noted.  -Taste-related adverse events (AEs) were the most frequent reported AEs; considered mechanism-based non-serious adverse drug reactions and are expected for MK-7264. To date, they have been fully and rapidly reversible after discontinuation of the drug.  -Female and female rats given 2000 mg/kg MK-7264 as a single dose or repeated dose for 7 days developed crystalluria. In humans, crystals of MK-7264 in urine (with no evidence of urinary tract injury) were only detected at a dose of 1800 mg BID over 14 days. Subjects will be monitored for unexplained hematuria and potential  crystal formation periodically throughout the clinical investigation.

## 2016-07-10 NOTE — Progress Notes (Signed)
Misty Woods    [id number]    August 27, 1945  Primary Care Physician:Griffin, Jenny Reichmann, MD  Referring Physician: Lavone Orn, MD [facility] [employer] [address] [place], [place] [address]  Chief complaint:  Follow up for chronic cough.  HPI: Misty Woods is a 71 year old with history of chronic cough for the past 9 years. She was originally evaluated by Dr. Melvyn Novas in 2011 she was diagnosed with LPR, GERD and was placed on PPI twice a day and diet modification. This helped somewhat with her symptoms however for the past year she's noticed worsening cough. She was recently started on Flonase and Zantac was added to Protonix 40 mg twice a day. This has not changed his symptoms. We started her on chlorpheniramine, dymista, hycodan with no change in symptoms. She is not able to tolerate the chlorpheniramine as it makes her loopy. She is only taking this at 4 mg bid.   She has   chronic daily hacking cough. This is worsened by cold air of water. She does not have any shortness of breath, wheezing. Seen by Dr. [name], ENT who did a laryngoscopy. As per the patient he did not find anything abnormal. He may consider doing a CT scan of the sinuses. She has stopped the dymista nasal spray since she feels it does not help. She continues on the Zyrtec. She was seen by GI by Dr. Rosalie Gums  and had a scope which showed multiple gastric polyps. Biopsies are still pending. She was told to stop the PPI. There is no evidence of esophagitis or reflux.  Interim history: She has been started on gabapentin at a low dose of 100 mg which was then increased to 300mg  at night and she reports minor improvement in cough at night. She continues to have unchanged cough during the daytime. She has been evaluated by Dr. [name] at the [facility]. It is noted that her symptoms worsened around the same time she got up a pet parrot. Workup for hypersensitivity serologies are pending  She is off PPI due to gastric polyps seen on  last EGD. She reports slightly increased heartburn and has a follow-up with GI later this month.   Outpatient Encounter Prescriptions as of 07/10/2016  Medication Sig  . calcium citrate-vitamin D (CITRACAL+D) 315-200 MG-UNIT tablet Take by mouth.  . cyanocobalamin (TH VITAMIN B12) 100 MCG tablet Take by mouth.  . gabapentin (NEURONTIN) 300 MG capsule Take 1 capsule (300 mg total) by mouth daily.  Marland Kitchen levothyroxine (SYNTHROID, LEVOTHROID) 100 MCG tablet Take 100 mcg by mouth daily before breakfast.  . [DISCONTINUED] Respiratory Therapy Supplies (FLUTTER) DEVI Use as directed.  . [DISCONTINUED] gabapentin (NEURONTIN) 300 MG capsule Take 1 capsule (300 mg total) by mouth daily.   No facility-administered encounter medications on file as of 07/10/2016.     Allergies as of 07/10/2016 - Review Complete 07/10/2016  Allergen Reaction Noted  . Propoxyphene hcl    . Tramadol hcl    . Propoxyphene Rash 02/11/2012    No past medical history on file.  Past Surgical History:  Procedure Laterality Date  . VIDEO BRONCHOSCOPY Bilateral 04/11/2016   Procedure: VIDEO BRONCHOSCOPY WITHOUT FLUORO;  Surgeon: [name], MD;  Location: [place] [facility];  Service: Cardiopulmonary;  Laterality: Bilateral;    Family History  Problem Relation Age of Onset  . Allergic rhinitis Brother   . Asthma Brother   . Angioedema Neg Hx   . Atopy Neg Hx   . Eczema  Neg Hx   . Immunodeficiency Neg Hx     Social History   Social History  . Marital status: Married    Spouse name: N/A  . Number of children: N/A  . Years of education: N/A   Occupational History  . Not on file.   Social History Main Topics  . Smoking status: Former Smoker    Packs/day: 1.00    Years: 5.00    Types: Cigarettes    Quit date: 02/05/1968  . Smokeless tobacco: Never Used  . Alcohol use Yes     Comment: Social  . Drug use: No  . Sexual activity: Not on file   Other Topics Concern  . Not on file   Social History Narrative  . No  narrative on file    Review of systems: Review of Systems  Constitutional: Negative for fever and chills.  HENT: Negative.   Eyes: Negative for blurred vision.  Respiratory: as per HPI  Cardiovascular: Negative for chest pain and palpitations.  Gastrointestinal: Negative for vomiting, diarrhea, blood per rectum. Genitourinary: Negative for dysuria, urgency, frequency and hematuria.  Musculoskeletal: Negative for myalgias, back pain and joint pain.  Skin: Negative for itching and rash.  Neurological: Negative for dizziness, tremors, focal weakness, seizures and loss of consciousness.  Endo/Heme/Allergies: Negative for environmental allergies.  Psychiatric/Behavioral: Negative for depression, suicidal ideas and hallucinations.  All other systems reviewed and are negative.  Physical Exam: Blood pressure 124/72, pulse 65, height 5' 1.5" (1.562 m), weight 130 lb 12.8 oz (59.3 kg), SpO2 99 %. Gen:      No acute distress HEENT:  EOMI, sclera anicteric Neck:     No masses; no thyromegaly Lungs:    Clear to auscultation bilaterally; normal respiratory effort CV:         Regular rate and rhythm; no murmurs Abd:      + bowel sounds; soft, non-tender; no palpable masses, no distension Ext:    No edema; adequate peripheral perfusion Skin:      Warm and dry; no rash Neuro: alert and oriented x 3 Psych: normal mood and affect  Data Reviewed: CXR 10/06/15- No acute abnormalities. Images reviewed. High res CT of chest 12/05/15-no evidence of interstitial lung disease. No pulmonary findings to explain patient's cough. Aortic atherosclerosis. Coronary artery calcifications.  I have reviewed all images personally  FENO 10/25/15- 19  PFTs 12/11/15 FVC 2.72 (5%) FEV1 1.89 (97%) next and/F 69 Minimal obstructive lung disease. No broncho-dilator response. Methacholine challenge test negative.  Bronchocoscopy 04/11/16-normal airway inspection  Assessment:  Refractory chronic cough Her cough has  been refractory to multiple interventions including maximal treatment for postnasal drip, GERD.  She was evaluated by ENT and no abnormalities were found. She had a GI evaluation with no evidence of acid reflux but was noted to have multiple gastric polyps.  The symptoms are not very typical of asthma and FENO is low. Even so we tried pred taper and advair 250/50 with minimal improvement in symptoms. CT scan of the chest is normal and PFTs do not show any response to methacholine challenge. So far we have eliminated all upper airway, GI and obvious lung etiologies of cough.   The cough is probably neurogenic. She is in the lowest dose of gabapentin right now. I will increase it to 300 mg bid. If the cough continues unchanged after a adequate trial of gabapentin then we can consider enrolling her in an ongoing clinical trial of  MK-7264, a P2X3 receptor antagonist, which  has been evaluated in clinical studies for the treatment of chronic cough.   Agree with testing of serum abs for hypersensitivity pneumonitis. Typically psittacosis and hypersensitivity reactions show abnormalities on lung imaging but her high res CT of the chest and x rays have been normal. She was supposed to get a flutter valve but has not received it yet. We will give her one today. She also has a follow up with GI. We can consider esophageal manometry and ph probe for further eval. She will talk to Dr. Bonnye Fava regarding this.   Plan/Recommend ations: - Increase gabapentin to 300 mg bid - Give flutter valve.  - Follow up with GI and allergy clinics.  [name] MD [facility] Pager [phone] If no answer or after 3pm call: [phone] 07/10/2016, 9:33 AM  CC: Lavone Orn, MD  Dr. Charise Carwin MD Dr. [name] MD

## 2016-07-10 NOTE — Telephone Encounter (Signed)
Noted  

## 2016-07-10 NOTE — Telephone Encounter (Signed)
Misty Woods with [facility] called to advise on hypersensitivity test. Patient did bring bird droppings/feathers with her. The lab is unable to accept the specimen, but will go ahead and do the test on patient's serum.   Please advise if any further action is needed. Thank you.

## 2016-07-13 LAB — HYPERSENSITIVITY PNEUMONITIS
A. Pullulans Abs: NEGATIVE
A.Fumigatus #1 Abs: NEGATIVE
Micropolyspora faeni, IgG: NEGATIVE
Pigeon Serum Abs: NEGATIVE
Thermoact. Saccharii: NEGATIVE
Thermoactinomyces vulgaris, IgG: NEGATIVE

## 2016-07-15 ENCOUNTER — Ambulatory Visit: Payer: Self-pay | Admitting: Allergy and Immunology

## 2016-07-23 ENCOUNTER — Other Ambulatory Visit: Payer: Self-pay | Admitting: Gastroenterology

## 2016-07-23 DIAGNOSIS — Z8371 Family history of colonic polyps: Secondary | ICD-10-CM | POA: Diagnosis not present

## 2016-07-23 DIAGNOSIS — R05 Cough: Secondary | ICD-10-CM | POA: Diagnosis not present

## 2016-07-23 DIAGNOSIS — K317 Polyp of stomach and duodenum: Secondary | ICD-10-CM | POA: Diagnosis not present

## 2016-07-29 ENCOUNTER — Ambulatory Visit (HOSPITAL_COMMUNITY)
Admission: RE | Admit: 2016-07-29 | Discharge: 2016-07-29 | Disposition: A | Discharge status: Home / Self Care | Payer: Medicare Other | Source: Ambulatory Visit | Attending: Gastroenterology | Admitting: Gastroenterology

## 2016-07-29 ENCOUNTER — Encounter (HOSPITAL_COMMUNITY)
Admission: RE | Disposition: A | Discharge status: Home / Self Care | Payer: Self-pay | Source: Ambulatory Visit | Attending: Gastroenterology

## 2016-07-29 DIAGNOSIS — Z888 Allergy status to other drugs, medicaments and biological substances status: Secondary | ICD-10-CM | POA: Insufficient documentation

## 2016-07-29 DIAGNOSIS — Z825 Family history of asthma and other chronic lower respiratory diseases: Secondary | ICD-10-CM | POA: Diagnosis not present

## 2016-07-29 DIAGNOSIS — R05 Cough: Secondary | ICD-10-CM | POA: Diagnosis not present

## 2016-07-29 DIAGNOSIS — R12 Heartburn: Secondary | ICD-10-CM | POA: Diagnosis not present

## 2016-07-29 DIAGNOSIS — K317 Polyp of stomach and duodenum: Secondary | ICD-10-CM | POA: Insufficient documentation

## 2016-07-29 DIAGNOSIS — Z79899 Other long term (current) drug therapy: Secondary | ICD-10-CM | POA: Insufficient documentation

## 2016-07-29 HISTORY — PX: ESOPHAGEAL MANOMETRY: SHX5429

## 2016-07-29 HISTORY — PX: 24 HOUR PH STUDY: SHX5419

## 2016-07-29 SURGERY — MONITORING, ESOPHAGEAL PH, 24 HOUR

## 2016-07-29 MED ORDER — LIDOCAINE VISCOUS 2 % MT SOLN
OROMUCOSAL | Status: AC
  Filled 2016-07-29: qty 15

## 2016-07-29 SURGICAL SUPPLY — 2 items
FACESHIELD LNG OPTICON STERILE (SAFETY) IMPLANT
GLOVE BIO SURGEON STRL SZ8 (GLOVE) ×4 IMPLANT

## 2016-07-29 NOTE — Progress Notes (Signed)
Esophageal Manometry done per protocol. Pt tolerated well without complication. Ph with impedance probe placed at 35.5 per protocol. Pt tolerated well. Teaching done with pt using teachback regarding study and monitor. Pt voiced understanding and questions answered. Pt will return to unit to have probe removed and monitor downloaded 07/30/2016 at 11:30 or after.

## 2016-07-30 ENCOUNTER — Encounter (HOSPITAL_COMMUNITY): Payer: Self-pay | Admitting: Gastroenterology

## 2016-08-05 DIAGNOSIS — R05 Cough: Secondary | ICD-10-CM | POA: Diagnosis not present

## 2016-08-05 DIAGNOSIS — K219 Gastro-esophageal reflux disease without esophagitis: Secondary | ICD-10-CM | POA: Diagnosis not present

## 2016-09-18 DIAGNOSIS — S8265XA Nondisplaced fracture of lateral malleolus of left fibula, initial encounter for closed fracture: Secondary | ICD-10-CM | POA: Diagnosis not present

## 2016-09-18 DIAGNOSIS — M25572 Pain in left ankle and joints of left foot: Secondary | ICD-10-CM | POA: Diagnosis not present

## 2016-09-18 DIAGNOSIS — S60221A Contusion of right hand, initial encounter: Secondary | ICD-10-CM | POA: Diagnosis not present

## 2016-09-18 DIAGNOSIS — S50311A Abrasion of right elbow, initial encounter: Secondary | ICD-10-CM | POA: Diagnosis not present

## 2016-09-23 DIAGNOSIS — S82832A Other fracture of upper and lower end of left fibula, initial encounter for closed fracture: Secondary | ICD-10-CM | POA: Diagnosis not present

## 2016-09-30 DIAGNOSIS — S82832D Other fracture of upper and lower end of left fibula, subsequent encounter for closed fracture with routine healing: Secondary | ICD-10-CM | POA: Diagnosis not present

## 2016-10-08 ENCOUNTER — Ambulatory Visit (INDEPENDENT_AMBULATORY_CARE_PROVIDER_SITE_OTHER): Payer: Medicare Other | Admitting: Pulmonary Disease

## 2016-10-08 ENCOUNTER — Encounter: Payer: Self-pay | Admitting: Pulmonary Disease

## 2016-10-08 VITALS — BP 124/68 | HR 74 | Ht 61.5 in | Wt 130.0 lb

## 2016-10-08 DIAGNOSIS — R05 Cough: Secondary | ICD-10-CM

## 2016-10-08 DIAGNOSIS — R059 Cough, unspecified: Secondary | ICD-10-CM

## 2016-10-08 NOTE — Progress Notes (Addendum)
Misty Woods    [id number]    Oct 03, 1945  Primary Care Physician:Griffin, Jenny Reichmann, MD  Referring Physician: Lavone Orn, MD [place] [employer] [place] [address],  [address]  Chief complaint:  Follow up for chronic cough.  HPI: Misty Woods is a 71 year old with history of chronic cough for the past 9 years. Misty Woods was originally evaluated by Dr. Melvyn Novas in 2011 Misty Woods was diagnosed with LPR, GERD and was placed on PPI twice a day and diet modification. This helped somewhat with her symptoms however for the past year Misty Woods's noticed worsening cough. Misty Woods was recently started on Flonase and Zantac was added to Protonix 40 mg twice a day. This has not changed his symptoms. We started her on chlorpheniramine, dymista, hycodan with no change in symptoms. Misty Woods is not able to tolerate the chlorpheniramine as it makes her loopy. Misty Woods is only taking this at 4 mg bid.   Misty Woods has   chronic daily hacking cough. This is worsened by cold air of water. Misty Woods does not have any shortness of breath, wheezing. Seen by Dr. [name], ENT who did a laryngoscopy. As per the patient he did not find anything abnormal. He may consider doing a CT scan of the sinuses. Misty Woods has stopped the dymista nasal spray since Misty Woods feels it does not help. Misty Woods continues on the Zyrtec. Misty Woods was seen by GI by Dr. Rosalie Gums  and had a scope which showed multiple gastric polyps. Biopsies are still pending. Misty Woods was told to stop the PPI. There is no evidence of esophagitis or reflux.  Interim history: Misty Woods has been started on gabapentin at a low dose of 100 mg which was then increased to 300mg  at night and Misty Woods reports only minor improvement in cough. Misty Woods has been evaluated by Dr. [name] at the [facility]. It is noted that her symptoms worsened around the same time Misty Woods got up a pet parrot. Workup for hypersensitivity serologies are negative  Misty Woods was taken off PPI due to gastric polyps seen on last EGD. Misty Woods reports slightly increased heartburn.  Misty Woods underwent manometry that showed reflux and he is started on omeprazole 40 mg once daily.Misty Woods has a follow-up at [facility] later this month.  Outpatient Encounter Prescriptions as of 10/08/2016  Medication Sig  . calcium citrate-vitamin D (CITRACAL+D) 315-200 MG-UNIT tablet Take by mouth.  . cyanocobalamin (TH VITAMIN B12) 100 MCG tablet Take by mouth.  . gabapentin (NEURONTIN) 300 MG capsule Take 1 capsule (300 mg total) by mouth 2 (two) times daily.  Marland Kitchen levothyroxine (SYNTHROID, LEVOTHROID) 100 MCG tablet Take 100 mcg by mouth daily before breakfast.  . Respiratory Therapy Supplies (FLUTTER) DEVI Take as directed   No facility-administered encounter medications on file as of 10/08/2016.     Allergies as of 10/08/2016 - Review Complete 10/08/2016  Allergen Reaction Noted  . Propoxyphene hcl    . Tramadol hcl    . Propoxyphene Rash 02/11/2012    No past medical history on file.  Past Surgical History:  Procedure Laterality Date  . [place] STUDY N/A 07/29/2016   Procedure: [place] STUDY;  Surgeon: Otis Brace, MD;  Location: [facility];  Service: Gastroenterology;  Laterality: N/A;  . ESOPHAGEAL MANOMETRY N/A 07/29/2016   Procedure: ESOPHAGEAL MANOMETRY (EM);  Surgeon: Otis Brace, MD;  Location: [facility];  Service: Gastroenterology;  Laterality: N/A;  . VIDEO BRONCHOSCOPY Bilateral 04/11/2016   Procedure: VIDEO BRONCHOSCOPY WITHOUT FLUORO;  Surgeon: [name], MD;  Location: [facility];  Service: Cardiopulmonary;  Laterality: Bilateral;    Family History  Problem Relation Age of Onset  . Allergic rhinitis Brother   . Asthma Brother   . Angioedema Neg Hx   . Atopy Neg Hx   . Eczema Neg Hx   . Immunodeficiency Neg Hx     Social History   Social History  . Marital status: Married    Spouse name: N/A  . Number of children: N/A  . Years of education: N/A   Occupational History  . Not on file.   Social History Main Topics  . Smoking status: Former  Smoker    Packs/day: 1.00    Years: 5.00    Types: Cigarettes    Quit date: 02/05/1968  . Smokeless tobacco: Never Used  . Alcohol use Yes     Comment: Social  . Drug use: No  . Sexual activity: Not on file   Other Topics Concern  . Not on file   Social History Narrative  . No narrative on file    Review of systems: Review of Systems  Constitutional: Negative for fever and chills.  HENT: Negative.   Eyes: Negative for blurred vision.  Respiratory: as per HPI  Cardiovascular: Negative for chest pain and palpitations.  Gastrointestinal: Negative for vomiting, diarrhea, blood per rectum. Genitourinary: Negative for dysuria, urgency, frequency and hematuria.  Musculoskeletal: Negative for myalgias, back pain and joint pain.  Skin: Negative for itching and rash.  Neurological: Negative for dizziness, tremors, focal weakness, seizures and loss of consciousness.  Endo/Heme/Allergies: Negative for environmental allergies.  Psychiatric/Behavioral: Negative for depression, suicidal ideas and hallucinations.  All other systems reviewed and are negative.  Physical Exam: Blood pressure 124/68, pulse 74, height 5' 1.5" (1.562 m), weight 130 lb (59 kg), SpO2 97 %. Gen:      No acute distress HEENT:  EOMI, sclera anicteric Neck:     No masses; no thyromegaly Lungs:    Clear to auscultation bilaterally; normal respiratory effort CV:         Regular rate and rhythm; no murmurs Abd:      + bowel sounds; soft, non-tender; no palpable masses, no distension Ext:    No edema; adequate peripheral perfusion Skin:      Warm and dry; no rash Neuro: alert and oriented x 3 Psych: normal mood and affect  Data Reviewed: CXR 10/06/15- No acute abnormalities. Images reviewed. High res CT of chest 12/05/15-no evidence of interstitial lung disease. No pulmonary findings to explain patient's cough. Aortic atherosclerosis. Coronary artery calcifications.  I have reviewed all images personally  FENO  10/25/15- 19  PFTs 12/11/15 FVC 2.72 (5%) FEV1 1.89 (97%) next and/F 69 Minimal obstructive lung disease. No broncho-dilator response. Methacholine challenge test negative.  Bronchocoscopy 04/11/16-normal airway inspection  Assessment:  Refractory chronic cough Her cough has been refractory to multiple interventions including maximal treatment for postnasal drip, GERD.  Misty Woods was evaluated by ENT and no abnormalities were found. Misty Woods had a GI evaluation with a mano that apparently showed reflux but Misty Woods cannot tolerate maximal acid suppression as Misty Woods has developed gastric polyps that was though to be secondary to PPI.  The symptoms are not very typical of asthma and FENO is low. Even so we tried pred taper and advair 250/50 with minimal improvement in symptoms. Bronch, CT scan of the chest is normal and PFTs do not show any response to methacholine challenge. So far we have eliminated all upper airway and obvious lung  etiologies of cough.   The cough is probably neurogenic. Misty Woods has been on gabapentin with minimal response and does not want to up titrate the dose due to fear of sideeffects. Misty Woods is interested in an enrolling with a radomised cough study. We will give her the consent form for [facility] study review. Hold gabapentin for now.  GERD, Gastric polyps Currently on 40 mg of omeprazole. Follow up arranged at [facility] for assessment of gastric polyps.  Plan/Recommend ations: - Hold gabapenin and Misty Woods wants to enroll in the randomized cough study at [facility] - Follow up with GI clinic at [facility].    [name] MD [facility] Pager [phone] If no answer or after 3pm call: [phone] 10/08/2016, 10:10 AM  CC: Lavone Orn, MD  Dr. Charise Carwin MD Dr. [name] MD

## 2016-10-08 NOTE — Patient Instructions (Signed)
Hold the gabapentin as we will assess you for enrollment in clinical trial for cough Continue the omeprazole and continue with GI in [facility]  Follow up in 3 months

## 2016-10-29 DIAGNOSIS — C44319 Basal cell carcinoma of skin of other parts of face: Secondary | ICD-10-CM | POA: Diagnosis not present

## 2016-10-29 DIAGNOSIS — D485 Neoplasm of uncertain behavior of skin: Secondary | ICD-10-CM | POA: Diagnosis not present

## 2016-10-29 DIAGNOSIS — Z23 Encounter for immunization: Secondary | ICD-10-CM | POA: Diagnosis not present

## 2016-11-04 ENCOUNTER — Encounter: Payer: Self-pay | Admitting: Pulmonary Disease

## 2016-11-04 NOTE — Telephone Encounter (Signed)
PM, patient saw you on 10/08/16 and was told to hold her gabapentin until her enrollment in the clinical trail for cough had been approved. She now states she will be not be participating in the trial. She wants to know if you want her to restart taking her gabapentin.   Please advise. Thanks!

## 2016-11-05 DIAGNOSIS — R05 Cough: Secondary | ICD-10-CM | POA: Diagnosis not present

## 2016-11-05 DIAGNOSIS — K317 Polyp of stomach and duodenum: Secondary | ICD-10-CM | POA: Diagnosis not present

## 2016-11-06 DIAGNOSIS — S82832D Other fracture of upper and lower end of left fibula, subsequent encounter for closed fracture with routine healing: Secondary | ICD-10-CM | POA: Diagnosis not present

## 2016-11-07 ENCOUNTER — Encounter: Payer: Self-pay | Admitting: Pulmonary Disease

## 2016-11-07 NOTE — Telephone Encounter (Signed)
Yes. I heard that unfortunately the study closed  Please restart gabapentin  Thanks Tiarah Shisler

## 2016-11-07 NOTE — Telephone Encounter (Signed)
Still waiting for an answer in regards to restarting the gabapentin.  The doctor at [facility] took me off omeprazol and said I do not have reflux.  I sent the original message on Monday. Today is Thursday.  Please let me know ASAP.  Especially since I will not be on the research trial.  Misty Woods    PM please advise if the pt should restart the gabapentin.  She stated that she has been waiting since Monday for an response. Thanks

## 2016-11-13 DIAGNOSIS — Z23 Encounter for immunization: Secondary | ICD-10-CM | POA: Diagnosis not present

## 2016-11-18 ENCOUNTER — Encounter: Payer: Self-pay | Admitting: Pulmonary Disease

## 2016-11-18 NOTE — Telephone Encounter (Signed)
Received MyChart message that she is taking gabapentin 300mg  at night. She wants to know if she can take a morning dose as well since the cough has not approved.   PM, please advise. Thanks!

## 2016-11-19 NOTE — Telephone Encounter (Signed)
Yes. Increase Gabapentin to 300 mg twice daily. I hope this helps with the cough

## 2016-11-28 DIAGNOSIS — K317 Polyp of stomach and duodenum: Secondary | ICD-10-CM | POA: Diagnosis not present

## 2016-11-28 DIAGNOSIS — Z8371 Family history of colonic polyps: Secondary | ICD-10-CM | POA: Diagnosis not present

## 2016-11-28 DIAGNOSIS — R05 Cough: Secondary | ICD-10-CM | POA: Diagnosis not present

## 2016-12-03 DIAGNOSIS — L723 Sebaceous cyst: Secondary | ICD-10-CM | POA: Diagnosis not present

## 2016-12-03 DIAGNOSIS — L821 Other seborrheic keratosis: Secondary | ICD-10-CM | POA: Diagnosis not present

## 2016-12-03 DIAGNOSIS — D2271 Melanocytic nevi of right lower limb, including hip: Secondary | ICD-10-CM | POA: Diagnosis not present

## 2016-12-03 DIAGNOSIS — D2272 Melanocytic nevi of left lower limb, including hip: Secondary | ICD-10-CM | POA: Diagnosis not present

## 2016-12-03 DIAGNOSIS — D225 Melanocytic nevi of trunk: Secondary | ICD-10-CM | POA: Diagnosis not present

## 2016-12-03 DIAGNOSIS — Z23 Encounter for immunization: Secondary | ICD-10-CM | POA: Diagnosis not present

## 2016-12-03 DIAGNOSIS — C44319 Basal cell carcinoma of skin of other parts of face: Secondary | ICD-10-CM | POA: Diagnosis not present

## 2016-12-03 DIAGNOSIS — Z86018 Personal history of other benign neoplasm: Secondary | ICD-10-CM | POA: Diagnosis not present

## 2016-12-09 DIAGNOSIS — S82832D Other fracture of upper and lower end of left fibula, subsequent encounter for closed fracture with routine healing: Secondary | ICD-10-CM | POA: Diagnosis not present

## 2017-01-06 ENCOUNTER — Encounter: Payer: Self-pay | Admitting: Pulmonary Disease

## 2017-01-06 ENCOUNTER — Ambulatory Visit (INDEPENDENT_AMBULATORY_CARE_PROVIDER_SITE_OTHER): Payer: Medicare Other | Admitting: Pulmonary Disease

## 2017-01-06 VITALS — BP 124/70 | HR 66 | Ht 62.0 in | Wt 132.2 lb

## 2017-01-06 DIAGNOSIS — R05 Cough: Secondary | ICD-10-CM

## 2017-01-06 DIAGNOSIS — R059 Cough, unspecified: Secondary | ICD-10-CM

## 2017-01-06 LAB — POCT EXHALED NITRIC OXIDE: FENO LEVEL (PPB): 14

## 2017-01-06 MED ORDER — GABAPENTIN 300 MG PO CAPS: 600.0000 mg | ORAL_CAPSULE | Freq: Two times a day (BID) | ORAL | 2 refills | Status: DC

## 2017-01-06 MED ORDER — PREDNISONE 10 MG PO TABS: 10.0000 mg | ORAL_TABLET | Freq: Every day | ORAL | 0 refills | Status: DC

## 2017-01-06 MED ORDER — AZELASTINE-FLUTICASONE 137-50 MCG/ACT NA SUSP: 1.0000 | Freq: Every day | NASAL | 2 refills | Status: DC

## 2017-01-06 NOTE — Patient Instructions (Signed)
We will give prednisone course.  Use 40 mg a day for 5 days. Increase gabapentin to 600 mg twice daily Try chlorpheniramine 8 mg 3 times daily and Dymista nasal spray  Follow-up in 1-2 months.

## 2017-01-06 NOTE — Addendum Note (Signed)
Addended by: Rocky Morel D on: 01/06/2017 03:09 PM   Modules accepted: Orders

## 2017-01-06 NOTE — Progress Notes (Signed)
Misty Woods    [id number]    [birth year]  Primary Care Physician:Griffin, Jenny Reichmann, MD  Referring Physician: Lavone Orn, MD [place] [employer] [address], [place] [address]  Chief complaint:  Follow up for chronic cough.  HPI: Misty Woods is a 71 year old with history of chronic cough for the past 9 years. She was originally evaluated by Dr. Melvyn Novas in 2011 she was diagnosed with LPR, GERD and was placed on PPI twice a day and diet modification. This helped somewhat with her symptoms since 2017 she's noticed worsening cough. She was recently started on Flonase and Zantac was added to Protonix 40 mg twice a day. This has not changed his symptoms. We started her on chlorpheniramine, dymista, hycodan with no change in symptoms. She is not able to tolerate the chlorpheniramine as it makes her loopy. She is only taking this at 4 mg bid.   She has chronic daily hacking cough. This is worsened by cold air of water. She does not have any shortness of breath, wheezing. Seen by Dr. [name], ENT who did a laryngoscopy. As per the patient he did not find anything abnormal. He may consider doing a CT scan of the sinuses. She has stopped the dymista nasal spray since she feels it does not help. She continues on the Zyrtec. She was seen by GI by Dr. Rosalie Gums  and had a scope which showed multiple gastric polyps. Followed up with GI at [facility]  Interim history: She has been started on gabapentin at a low dose of 100 mg which was then increased to 300mg  at night and she reports only minor improvement in cough. She has been evaluated by Dr. [name] at the [facility]. It is noted that her symptoms worsened around the same time she got up a pet parrot. Workup for hypersensitivity serologies are negative Continue to have persistent cough without any change in symptoms.  Outpatient Encounter Medications as of 01/06/2017  Medication Sig  . calcium citrate-vitamin D (CITRACAL+D) 315-200 MG-UNIT tablet  Take by mouth.  . Cholecalciferol (VITAMIN D3) 5000 units CAPS Take 1 capsule by mouth daily.  . cyanocobalamin (TH VITAMIN B12) 100 MCG tablet Take by mouth.  . gabapentin (NEURONTIN) 300 MG capsule Take 1 capsule (300 mg total) by mouth 2 (two) times daily.  Marland Kitchen levothyroxine (SYNTHROID, LEVOTHROID) 100 MCG tablet Take 100 mcg by mouth daily before breakfast.  . Respiratory Therapy Supplies (FLUTTER) DEVI Take as directed   No facility-administered encounter medications on file as of 01/06/2017.     Allergies as of 01/06/2017 - Review Complete 01/06/2017  Allergen Reaction Noted  . Propoxyphene hcl    . Tramadol hcl    . Propoxyphene Rash 02/11/2012    History reviewed. No pertinent past medical history.  Past Surgical History:  Procedure Laterality Date  . [place] STUDY N/A 07/29/2016   Procedure: [place] STUDY;  Surgeon: Otis Brace, MD;  [facility];  Service: Gastroenterology;  Laterality: N/A;  . ESOPHAGEAL MANOMETRY N/A 07/29/2016   Procedure: ESOPHAGEAL MANOMETRY (EM);  Surgeon: Otis Brace, MD;  [facility];  Service: Gastroenterology;  Laterality: N/A;  . VIDEO BRONCHOSCOPY Bilateral 04/11/2016   Procedure: VIDEO BRONCHOSCOPY WITHOUT FLUORO;  Surgeon: [name], MD;  Location: [place];  Service: Cardiopulmonary;  Laterality: Bilateral;    Family History  Problem Relation Age of Onset  . Allergic rhinitis Brother   . Asthma Brother   . Angioedema Neg  Hx   . Atopy Neg Hx   . Eczema Neg Hx   . Immunodeficiency Neg Hx     Social History   Socioeconomic History  . Marital status: Married    Spouse name: Not on file  . Number of children: Not on file  . Years of education: Not on file  . Highest education level: Not on file  Social Needs  . Financial resource strain: Not on file  . Food insecurity - worry: Not on file  . Food insecurity - inability: Not on file  . Transportation needs - medical: Not on file  .  Transportation needs - non-medical: Not on file  Occupational History  . Not on file  Tobacco Use  . Smoking status: Former Smoker    Packs/day: 1.00    Years: 5.00    Pack years: 5.00    Types: Cigarettes    Last attempt to quit: 02/05/1968    Years since quitting: 48.9  . Smokeless tobacco: Never Used  Substance and Sexual Activity  . Alcohol use: Yes    Comment: Social  . Drug use: No  . Sexual activity: Not on file  Other Topics Concern  . Not on file  Social History Narrative  . Not on file    Review of systems: Review of Systems  Constitutional: Negative for fever and chills.  HENT: Negative.   Eyes: Negative for blurred vision.  Respiratory: as per HPI  Cardiovascular: Negative for chest pain and palpitations.  Gastrointestinal: Negative for vomiting, diarrhea, blood per rectum. Genitourinary: Negative for dysuria, urgency, frequency and hematuria.  Musculoskeletal: Negative for myalgias, back pain and joint pain.  Skin: Negative for itching and rash.  Neurological: Negative for dizziness, tremors, focal weakness, seizures and loss of consciousness.  Endo/Heme/Allergies: Negative for environmental allergies.  Psychiatric/Behavioral: Negative for depression, suicidal ideas and hallucinations.  All other systems reviewed and are negative.  Physical Exam: Blood pressure 124/70, pulse 66, height 5\' 2"  (0.539 m), weight 132 lb 3.2 oz (60 kg), SpO2 95 %. Gen:      No acute distress HEENT:  EOMI, sclera anicteric Neck:     No masses; no thyromegaly Lungs:    Clear to auscultation bilaterally; normal respiratory effort CV:         Regular rate and rhythm; no murmurs Abd:      + bowel sounds; soft, non-tender; no palpable masses, no distension Ext:    No edema; adequate peripheral perfusion Skin:      Warm and dry; no rash Neuro: alert and oriented x 3 Psych: normal mood and affect  Data Reviewed: CXR 10/06/15- No acute abnormalities. Images reviewed. High res CT of  chest 12/05/15-no evidence of interstitial lung disease. No pulmonary findings to explain patient's cough. Aortic atherosclerosis. Coronary artery calcifications.  I have reviewed all images personally  FENO 10/25/15- 19 FENO 01/06/17- 14  PFTs 12/11/15 FVC 2.72 (5%) FEV1 1.89 (97%) next and/F 69 Minimal obstructive lung disease. No broncho-dilator response. Methacholine challenge test negative.  Bronchocoscopy 04/11/16-normal airway inspection  Assessment:  Refractory chronic cough Her cough has been refractory to multiple interventions including maximal treatment for postnasal drip, GERD.  She was evaluated by ENT and no abnormalities were found. She had a GI evaluation with a mano that apparently showed reflux but she cannot tolerate maximal acid suppression as she has developed gastric polyps that was though to be secondary to PPI.  The symptoms are not very typical of asthma and FENO is low.  Even so will try a pred taper empirically. Bronch, CT scan of the chest is normal and PFTs do not show any response to methacholine challenge. So far we have eliminated all upper airway and obvious lung etiologies of cough.   The cough is probably neurogenic. She has been on gabapentin with minimal response. Up titrate gabapentin to 600mg  tid. She does not want to use any cough meds with narcotics We reattempt to treat post nasal drip with chlorpheniramine and dymista nasal spray.  GERD, Gastric polyps Taken off PPI by her GI doctor at [facility]. She was told that she does not have acid reflux.   Plan/Recommend ations: - Increase gabapentin to 600 mg bid - Prednisone 40 mg/day x 5 days - Use chlorpheniramine 8 mg tid and dymista nasal spray   [name] MD [facility] Pager [phone] If no answer or after 3pm call: [phone] 01/06/2017, 2:31 PM  CC: Lavone Orn, MD  Dr. Charise Carwin MD Dr. [name] MD

## 2017-01-08 DIAGNOSIS — R05 Cough: Secondary | ICD-10-CM | POA: Diagnosis not present

## 2017-01-08 DIAGNOSIS — R030 Elevated blood-pressure reading, without diagnosis of hypertension: Secondary | ICD-10-CM | POA: Diagnosis not present

## 2017-01-08 DIAGNOSIS — M8580 Other specified disorders of bone density and structure, unspecified site: Secondary | ICD-10-CM | POA: Diagnosis not present

## 2017-01-08 DIAGNOSIS — E7800 Pure hypercholesterolemia, unspecified: Secondary | ICD-10-CM | POA: Diagnosis not present

## 2017-01-08 DIAGNOSIS — Z1389 Encounter for screening for other disorder: Secondary | ICD-10-CM | POA: Diagnosis not present

## 2017-01-08 DIAGNOSIS — Z0000 Encounter for general adult medical examination without abnormal findings: Secondary | ICD-10-CM | POA: Diagnosis not present

## 2017-01-08 DIAGNOSIS — E890 Postprocedural hypothyroidism: Secondary | ICD-10-CM | POA: Diagnosis not present

## 2017-01-08 DIAGNOSIS — D509 Iron deficiency anemia, unspecified: Secondary | ICD-10-CM | POA: Diagnosis not present

## 2017-01-10 ENCOUNTER — Other Ambulatory Visit: Payer: Self-pay | Admitting: Internal Medicine

## 2017-01-10 DIAGNOSIS — E7800 Pure hypercholesterolemia, unspecified: Secondary | ICD-10-CM

## 2017-01-16 DIAGNOSIS — C44319 Basal cell carcinoma of skin of other parts of face: Secondary | ICD-10-CM | POA: Diagnosis not present

## 2017-01-17 ENCOUNTER — Ambulatory Visit
Admission: RE | Admit: 2017-01-17 | Discharge: 2017-01-17 | Disposition: A | Discharge status: Home / Self Care | Payer: Medicare Other | Source: Ambulatory Visit | Attending: Internal Medicine | Admitting: Internal Medicine

## 2017-01-17 DIAGNOSIS — E7800 Pure hypercholesterolemia, unspecified: Secondary | ICD-10-CM

## 2017-01-23 ENCOUNTER — Telehealth: Payer: Self-pay

## 2017-01-23 NOTE — Telephone Encounter (Signed)
SENT NOTES TO SCHEDULING 

## 2017-01-24 ENCOUNTER — Telehealth: Payer: Self-pay

## 2017-01-24 NOTE — Telephone Encounter (Signed)
Sent notes to scheduling 

## 2017-02-10 ENCOUNTER — Telehealth: Payer: Self-pay | Admitting: Pulmonary Disease

## 2017-02-10 MED ORDER — GABAPENTIN 300 MG PO CAPS: 600.0000 mg | ORAL_CAPSULE | Freq: Two times a day (BID) | ORAL | 3 refills | Status: DC

## 2017-02-10 NOTE — Telephone Encounter (Signed)
Pt is calling back [phone]

## 2017-02-10 NOTE — Telephone Encounter (Signed)
Spoke with pt, states she has new rx drug coverage and that her Gabapentin needs to be sent 90 day supply to her new mail order pharmacy.  Pt is unsure of name of pharmacy.  Called number on back of pt's new rx insurance card, [facility]   rx has been sent.  Nothing further needed.

## 2017-02-10 NOTE — Telephone Encounter (Signed)
lmtcb x1 for pt. 

## 2017-02-26 DIAGNOSIS — E7800 Pure hypercholesterolemia, unspecified: Secondary | ICD-10-CM | POA: Diagnosis not present

## 2017-02-26 DIAGNOSIS — Z5181 Encounter for therapeutic drug level monitoring: Secondary | ICD-10-CM | POA: Diagnosis not present

## 2017-03-03 ENCOUNTER — Encounter: Payer: Self-pay | Admitting: Pulmonary Disease

## 2017-03-03 ENCOUNTER — Ambulatory Visit (INDEPENDENT_AMBULATORY_CARE_PROVIDER_SITE_OTHER): Payer: Medicare Other | Admitting: Pulmonary Disease

## 2017-03-03 VITALS — BP 124/78 | HR 78 | Ht 61.5 in | Wt 133.8 lb

## 2017-03-03 DIAGNOSIS — R05 Cough: Secondary | ICD-10-CM

## 2017-03-03 DIAGNOSIS — R059 Cough, unspecified: Secondary | ICD-10-CM

## 2017-03-03 NOTE — Patient Instructions (Signed)
I am glad that your cough is much improved Please start coming down on the gabapentin.  You can come down to 300 mg twice daily for a month and then 300 mg a day  For a month and then stop Continue the chlorpheniramine as you are doing I will see you back in clinic in 3 months.

## 2017-03-03 NOTE — Progress Notes (Signed)
Misty Woods    [id number]    Jun 16, 1945  Primary Care Physician:Griffin, Jenny Reichmann, MD  Referring Physician: Lavone Orn, MD [facility] [employer] [facility] [address], [place] [address]  Chief complaint:  Follow up for chronic cough.  HPI: Misty Woods is a 72 year old with history of chronic cough for the past 9 years. She was originally evaluated by Dr. Melvyn Novas in 2011 she was diagnosed with LPR, GERD and was placed on PPI twice a day and diet modification. This helped somewhat with her symptoms since 2017 she's noticed worsening cough. She was recently started on Flonase and Zantac was added to Protonix 40 mg twice a day. This has not changed his symptoms. We started her on chlorpheniramine, dymista, hycodan with no change in symptoms. She was not able to tolerate the chlorpheniramine as it makes her loopy.    She has chronic daily hacking cough. This is worsened by cold air of water. She does not have any shortness of breath, wheezing. Seen by Dr. [name], ENT who did a laryngoscopy. As per the patient he did not find anything abnormal. He may consider doing a CT scan of the sinuses. She has stopped the dymista nasal spray since she feels it does not help. She continues on the Zyrtec. She was seen by GI by Dr. Rosalie Gums  and had a scope which showed multiple gastric polyps. She has a manometry study and followed up with GI at [facility].he was told that she does not have GERD and was taken off PPI.   She has been evaluated by Dr. [name] at the [facility]. It is noted that her symptoms worsened around the same time she got up a pet parrot. Workup for hypersensitivity serologies are negative  Interim history: Last visit gabapentin was increased to 600 twice daily, given a prednisone taper and put back on chlorpheniramine.  She reports that her cough has practically gone away.  She feels very relieved and attributes this to the chlorpheniramine.  Outpatient Encounter Medications as of 03/03/2017    Medication Sig  . calcium citrate-vitamin D (CITRACAL+D) 315-200 MG-UNIT tablet Take by mouth.  . chlorpheniramine (CHLOR-TRIMETON) 4 MG tablet Take 8 mg by mouth 2 (two) times daily.  . Cholecalciferol (VITAMIN D3) 5000 units CAPS Take 1 capsule by mouth daily.  . cyanocobalamin (TH VITAMIN B12) 100 MCG tablet Take by mouth.  . gabapentin (NEURONTIN) 300 MG capsule Take 2 capsules (600 mg total) by mouth 2 (two) times daily.  Marland Kitchen levothyroxine (SYNTHROID, LEVOTHROID) 100 MCG tablet Take 100 mcg by mouth daily before breakfast.  . Respiratory Therapy Supplies (FLUTTER) DEVI Take as directed  . [DISCONTINUED] Azelastine-Fluticasone (DYMISTA) 137-50 MCG/ACT SUSP Place 1 spray into the nose daily.  . [DISCONTINUED] predniSONE (DELTASONE) 10 MG tablet Take 1 tablet (10 mg total) by mouth daily with breakfast. Please use 40mg  a day for 5 days.  . rosuvastatin (CRESTOR) 5 MG tablet    No facility-administered encounter medications on file as of 03/03/2017.     Allergies as of 03/03/2017 - Review Complete 03/03/2017  Allergen Reaction Noted  . Propoxyphene hcl    . Tramadol hcl    . Propoxyphene Rash 02/11/2012    No past medical history on file.  Past Surgical History:  Procedure Laterality Date  . [place] STUDY N/A 07/29/2016   Procedure: [place] STUDY;  Surgeon: Otis Brace, MD;  Location: [facility];  Service: Gastroenterology;  Laterality: N/A;  .  ESOPHAGEAL MANOMETRY N/A 07/29/2016   Procedure: ESOPHAGEAL MANOMETRY (EM);  Surgeon: Otis Brace, MD;  Location: [facility];  Service: Gastroenterology;  Laterality: N/A;  . VIDEO BRONCHOSCOPY Bilateral 04/11/2016   Procedure: VIDEO BRONCHOSCOPY WITHOUT FLUORO;  Surgeon: [name], MD;  Location: [facility];  Service: Cardiopulmonary;  Laterality: Bilateral;    Family History  Problem Relation Age of Onset  . Allergic rhinitis Brother   . Asthma Brother   . Angioedema Neg Hx   . Atopy Neg Hx   . Eczema Neg Hx    . Immunodeficiency Neg Hx     Social History   Socioeconomic History  . Marital status: Married    Spouse name: Not on file  . Number of children: Not on file  . Years of education: Not on file  . Highest education level: Not on file  Social Needs  . Financial resource strain: Not on file  . Food insecurity - worry: Not on file  . Food insecurity - inability: Not on file  . Transportation needs - medical: Not on file  . Transportation needs - non-medical: Not on file  Occupational History  . Not on file  Tobacco Use  . Smoking status: Former Smoker    Packs/day: 1.00    Years: 5.00    Pack years: 5.00    Types: Cigarettes    Last attempt to quit: 02/05/1968    Years since quitting: 49.1  . Smokeless tobacco: Never Used  Substance and Sexual Activity  . Alcohol use: Yes    Comment: Social  . Drug use: No  . Sexual activity: Not on file  Other Topics Concern  . Not on file  Social History Narrative  . Not on file    Review of systems: Review of Systems  Constitutional: Negative for fever and chills.  HENT: Negative.   Eyes: Negative for blurred vision.  Respiratory: as per HPI  Cardiovascular: Negative for chest pain and palpitations.  Gastrointestinal: Negative for vomiting, diarrhea, blood per rectum. Genitourinary: Negative for dysuria, urgency, frequency and hematuria.  Musculoskeletal: Negative for myalgias, back pain and joint pain.  Skin: Negative for itching and rash.  Neurological: Negative for dizziness, tremors, focal weakness, seizures and loss of consciousness.  Endo/Heme/Allergies: Negative for environmental allergies.  Psychiatric/Behavioral: Negative for depression, suicidal ideas and hallucinations.  All other systems reviewed and are negative.  Physical Exam: Blood pressure 124/78, pulse 78, height 5' 1.5" (1.562 m), weight 133 lb 12.8 oz (60.7 kg), SpO2 94 %. Gen:      No acute distress HEENT:  EOMI, sclera anicteric Neck:     No masses; no  thyromegaly Lungs:    Clear to auscultation bilaterally; normal respiratory effort CV:         Regular rate and rhythm; no murmurs Abd:      + bowel sounds; soft, non-tender; no palpable masses, no distension Ext:    No edema; adequate peripheral perfusion Skin:      Warm and dry; no rash Neuro: alert and oriented x 3 Psych: normal mood and affect  Data Reviewed: CXR 10/06/15- No acute abnormalities. Images reviewed. High res CT of chest 12/05/15-no evidence of interstitial lung disease. No pulmonary findings to explain patient's cough. Aortic atherosclerosis. Coronary artery calcifications.  I have reviewed all images personally  FENO 10/25/15- 19 FENO 01/06/17- 14  PFTs 12/11/15 FVC 2.72 (5%) FEV1 1.89 (97%) next and/F 69 Minimal obstructive lung disease. No broncho-dilator response. Methacholine challenge test negative.  Bronchocoscopy 04/11/16-normal airway  inspection  Assessment:  Refractory chronic cough. Upper airway cough from post nasal drip vs neurogenic cough Her cough has been refractory to multiple interventions including treatment for postnasal drip, GERD.  She was evaluated by ENT and no abnormalities were found. She had a GI evaluation with a mano that apparently showed reflux but she cannot tolerate maximal acid suppression as she has developed gastric polyps that was though to be secondary to PPI. The symptoms are not very typical of asthma and FENO is low. Bronchoscopy, CT scan of the chest is normal and PFTs do not show any response to methacholine challenge. So far we have eliminated all obvious lung etiologies of cough.    She reports good improvement in cough for the past 1 month.  I am not sure which one of the interventions worked as we increase the gabapentin, give her a prednisone taper and chlorpheniramine at the same time. I am inclined to believe the chlorphentermine worked as she did not give the high-dose a good chance previously. She is currently using  chlorpheniramine 8 mg 2 times daily.  She is not on Dymista nasal spray We will continue the same for now I will start titrating her off the gabapentin.  If the cough recurs as we are doing this then we can conclude the cough is neurogenic and have to go back up on the dose of gabapentin.  GERD, Gastric polyps Taken off PPI by her GI doctor at [facility]. She was told that she does not have acid reflux.   Plan/Recommend ations: - Titrate down gabapentin. Reduce to 300 mg bid for one month, then 300 mg qd for one month, then off.  - Continue chlorpheniramine at current dose (8 mg bid)   [name] MD [facility] Pager [phone] If no answer or after 3pm call: [phone] 03/03/2017, 9:24 AM  CC: Lavone Orn, MD

## 2017-03-11 DIAGNOSIS — Q438 Other specified congenital malformations of intestine: Secondary | ICD-10-CM | POA: Diagnosis not present

## 2017-03-11 DIAGNOSIS — K2970 Gastritis, unspecified, without bleeding: Secondary | ICD-10-CM | POA: Diagnosis not present

## 2017-03-11 DIAGNOSIS — K6289 Other specified diseases of anus and rectum: Secondary | ICD-10-CM | POA: Diagnosis not present

## 2017-03-11 DIAGNOSIS — Z8371 Family history of colonic polyps: Secondary | ICD-10-CM | POA: Diagnosis not present

## 2017-03-11 DIAGNOSIS — I2510 Atherosclerotic heart disease of native coronary artery without angina pectoris: Secondary | ICD-10-CM | POA: Insufficient documentation

## 2017-03-11 DIAGNOSIS — K317 Polyp of stomach and duodenum: Secondary | ICD-10-CM | POA: Diagnosis not present

## 2017-03-11 DIAGNOSIS — Z1211 Encounter for screening for malignant neoplasm of colon: Secondary | ICD-10-CM | POA: Diagnosis not present

## 2017-03-11 DIAGNOSIS — E785 Hyperlipidemia, unspecified: Secondary | ICD-10-CM | POA: Insufficient documentation

## 2017-03-11 DIAGNOSIS — K210 Gastro-esophageal reflux disease with esophagitis: Secondary | ICD-10-CM | POA: Diagnosis not present

## 2017-03-11 DIAGNOSIS — D126 Benign neoplasm of colon, unspecified: Secondary | ICD-10-CM | POA: Diagnosis not present

## 2017-03-11 NOTE — Progress Notes (Signed)
Cardiology Office Note    Date:  03/12/2017   ID:  Misty Woods, Misty Woods [birth year], MRN [id number]  PCP:  Lavone Orn, MD  Cardiologist: Sinclair Grooms, MD   Chief Complaint  Patient presents with  . Coronary Artery Disease    History of Present Illness:  Misty Woods is a 72 y.o. female  Referred with 95 th percentile coronary calcification on CT scoring by Dr. Lavone Orn. LDL > 200 late December 2018 and now 111 on rosuvastatin 5 mg/day.  Prior history of global amnesia felt to be related to statin therapy.  Pleasant female with significant family history of vascular disease including mother who had coronary bypass grafting and father who had a history of hardening of the arteries.  She has a brother who has rhythm disturbance.  Family history of elevated lipids.  LDL cholesterol is greater than 200 December 18.  Coronary calcium scoring was performed and the patient had diffuse three-vessel calcification and was greater than 95th percentile for age.  She walks 20-30 minutes every day, briskly, and has done so for the past 3 years.  There is no exercise related syncope, chest pain, dyspnea, or edema.  History reviewed. No pertinent past medical history.  Past Surgical History:  Procedure Laterality Date  . [place] STUDY N/A 07/29/2016   Procedure: [place] STUDY;  Surgeon: Otis Brace, MD;  [facility];  Service: Gastroenterology;  Laterality: N/A;  . ESOPHAGEAL MANOMETRY N/A 07/29/2016   Procedure: ESOPHAGEAL MANOMETRY (EM);  Surgeon: Otis Brace, MD;  [facility];  Service: Gastroenterology;  Laterality: N/A;  . VIDEO BRONCHOSCOPY Bilateral 04/11/2016   Procedure: VIDEO BRONCHOSCOPY WITHOUT FLUORO;  Surgeon: [name], MD;  Location: [facility];  Service: Cardiopulmonary;  Laterality: Bilateral;    Current Medications: Outpatient Medications Prior to Visit  Medication Sig Dispense Refill  . aspirin EC 81 MG tablet Take 81 mg  by mouth daily.    . calcium citrate-vitamin D (CITRACAL+D) 315-200 MG-UNIT tablet Take by mouth.    . chlorpheniramine (CHLOR-TRIMETON) 4 MG tablet Take 8 mg by mouth 2 (two) times daily.    . Cholecalciferol (VITAMIN D3) 5000 units CAPS Take 1 capsule by mouth daily.    . cyanocobalamin (TH VITAMIN B12) 100 MCG tablet Take by mouth.    . gabapentin (NEURONTIN) 300 MG capsule Take 2 capsules (600 mg total) by mouth 2 (two) times daily. 360 capsule 3  . levothyroxine (SYNTHROID, LEVOTHROID) 100 MCG tablet Take 100 mcg by mouth daily before breakfast.    . omeprazole (PRILOSEC) 40 MG capsule Take 40 mg by mouth daily.    Marland Kitchen Respiratory Therapy Supplies (FLUTTER) DEVI Take as directed 1 each 0  . rosuvastatin (CRESTOR) 5 MG tablet      No facility-administered medications prior to visit.      Allergies:   Propoxyphene hcl; Tramadol hcl; and Propoxyphene   Social History   Socioeconomic History  . Marital status: Married    Spouse name: None  . Number of children: None  . Years of education: None  . Highest education level: None  Social Needs  . Financial resource strain: None  . Food insecurity - worry: None  . Food insecurity - inability: None  . Transportation needs - medical: None  . Transportation needs - non-medical: None  Occupational History  . None  Tobacco Use  . Smoking status: Former Smoker    Packs/day: 1.00    Years: 5.00  Pack years: 5.00    Types: Cigarettes    Last attempt to quit: 02/05/1968    Years since quitting: 49.1  . Smokeless tobacco: Never Used  Substance and Sexual Activity  . Alcohol use: Yes    Comment: Social  . Drug use: No  . Sexual activity: None  Other Topics Concern  . None  Social History Narrative  . None     Family History:  The patient's family history includes Allergic rhinitis in her brother; Asthma in her brother.   ROS:   Please see the history of present illness.    Difficulty with hearing, now wearing hearing aids.  Has  chronic cough.  Smoked cigarettes but discontinued 42 years ago. All other systems reviewed and are negative.   PHYSICAL EXAM:   VS:  BP 110/62   Pulse 73   Ht 5' 1.5" (1.562 m)   Wt 131 lb (59.4 kg)   SpO2 97%   BMI 24.35 kg/m    GEN: Well nourished, well developed, in no acute distress  HEENT: normal  Neck: no JVD, carotid bruits, or masses Cardiac: RRR; no murmurs, rubs, or gallops,no edema  Respiratory:  clear to auscultation bilaterally, normal work of breathing GI: soft, nontender, nondistended, + BS MS: no deformity or atrophy  Skin: warm and dry, no rash Neuro:  Alert and Oriented x 3, Strength and sensation are intact Psych: euthymic mood, full affect  Wt Readings from Last 3 Encounters:  03/12/17 131 lb (59.4 kg)  03/03/17 133 lb 12.8 oz (60.7 kg)  01/06/17 132 lb 3.2 oz (60 kg)      Studies/Labs Reviewed:   EKG:  EKG normal sinus rhythm with nonspecific ST-T wave abnormality, II, III, and aVF.  Otherwise normal.  Recent Labs: No results found for requested labs within last 8760 hours.   Lipid Panel No results found for: CHOL, TRIG, HDL, CHOLHDL, VLDL, LDLCALC, LDLDIRECT  Additional studies/ records that were reviewed today include:  Coronary scoring 2018: IMPRESSION: Age advanced coronary artery calcifications. Calcium score is 685 and at 94 percentile for patients of the same age, gender and ethnicity.      ASSESSMENT:    1. HYPERCHOLESTEROLEMIA   2. Coronary artery calcification seen on CAT scan   3. Transient global amnesia   4. Hyperlipidemia LDL goal <70      PLAN:  In order of problems listed above:  1. Agree with statin therapy for elevated lipids with LDL greater than 200 untreated.  This places her in a high risk category.  We should target LDL less than 90.  Recommend increasing rosuvastatin to 10 mg/day. 2. Stress myocardial perfusion imaging to exclude ischemia given extensive coronary calcification.  Based upon symptoms, there  should not be obstructive disease but testing will give [place] good baseline for follow-up. 3. No recurrence.  Was attributed to statin therapy in the remote past. 4. Familial hypercholesterolemia/genetic hyperlipidemia is highly likely.  LDL untreated greater than 200 places her at high risk.  LDL target should be 70.  Agree with statin therapy reinstitution.  If side effects, consider PC SK 9.  Clinical follow-up as required depending upon myocardial perfusion imaging.  I agree with the workup and management so far by Dr. Lavone Orn.    Medication Adjustments/Labs and Tests Ordered: Current medicines are reviewed at length with the patient today.  Concerns regarding medicines are outlined above.  Medication changes, Labs and Tests ordered today are listed in the Patient Instructions below. Patient Instructions  Medication Instructions:  Your physician recommends that you continue on your current medications as directed. Please refer to the Current Medication list given to you today.  Labwork: None  Testing/Procedures: Your physician has requested that you have en exercise stress myoview. For further information please visit [email or web address]. Please follow instruction sheet, as given.   Follow-Up: Your physician recommends that you schedule a follow-up appointment as needed with Dr. Tamala Julian.    Any Other Special Instructions Will Be Listed Below (If Applicable).     If you need a refill on your cardiac medications before your next appointment, please call your pharmacy.      Signed, Sinclair Grooms, MD  03/12/2017 9:29 AM    [facility] [address], [place], [place]  [address] Phone: [phone]; Fax: [phone]

## 2017-03-12 ENCOUNTER — Encounter: Payer: Self-pay | Admitting: Interventional Cardiology

## 2017-03-12 ENCOUNTER — Telehealth (HOSPITAL_COMMUNITY): Payer: Self-pay | Admitting: *Deleted

## 2017-03-12 ENCOUNTER — Ambulatory Visit (INDEPENDENT_AMBULATORY_CARE_PROVIDER_SITE_OTHER): Payer: Medicare Other | Admitting: Interventional Cardiology

## 2017-03-12 VITALS — BP 110/62 | HR 73 | Ht 61.5 in | Wt 131.0 lb

## 2017-03-12 DIAGNOSIS — I2510 Atherosclerotic heart disease of native coronary artery without angina pectoris: Secondary | ICD-10-CM | POA: Diagnosis not present

## 2017-03-12 DIAGNOSIS — H10013 Acute follicular conjunctivitis, bilateral: Secondary | ICD-10-CM | POA: Diagnosis not present

## 2017-03-12 DIAGNOSIS — E785 Hyperlipidemia, unspecified: Secondary | ICD-10-CM | POA: Diagnosis not present

## 2017-03-12 DIAGNOSIS — E7800 Pure hypercholesterolemia, unspecified: Secondary | ICD-10-CM | POA: Diagnosis not present

## 2017-03-12 DIAGNOSIS — H524 Presbyopia: Secondary | ICD-10-CM | POA: Diagnosis not present

## 2017-03-12 DIAGNOSIS — G454 Transient global amnesia: Secondary | ICD-10-CM | POA: Diagnosis not present

## 2017-03-12 NOTE — Telephone Encounter (Signed)
Patient given detailed instructions per Myocardial Perfusion Study Information Sheet for the test on 03/17/17. Patient notified to arrive 15 minutes early and that it is imperative to arrive on time for appointment to keep from having the test rescheduled.  If you need to cancel or reschedule your appointment, please call the office within 24 hours of your appointment. . Patient verbalized understanding. Kirstie Peri

## 2017-03-12 NOTE — Patient Instructions (Signed)
Medication Instructions:  Your physician recommends that you continue on your current medications as directed. Please refer to the Current Medication list given to you today.  Labwork: None  Testing/Procedures: Your physician has requested that you have en exercise stress myoview. For further information please visit [email or web address]. Please follow instruction sheet, as given.   Follow-Up: Your physician recommends that you schedule a follow-up appointment as needed with Dr. Smith.    Any Other Special Instructions Will Be Listed Below (If Applicable).     If you need a refill on your cardiac medications before your next appointment, please call your pharmacy.   

## 2017-03-17 ENCOUNTER — Ambulatory Visit (HOSPITAL_COMMUNITY): Payer: Medicare Other | Attending: Cardiology

## 2017-03-17 DIAGNOSIS — Z1211 Encounter for screening for malignant neoplasm of colon: Secondary | ICD-10-CM | POA: Diagnosis not present

## 2017-03-17 DIAGNOSIS — D126 Benign neoplasm of colon, unspecified: Secondary | ICD-10-CM | POA: Diagnosis not present

## 2017-03-17 DIAGNOSIS — K317 Polyp of stomach and duodenum: Secondary | ICD-10-CM | POA: Diagnosis not present

## 2017-03-17 DIAGNOSIS — I2510 Atherosclerotic heart disease of native coronary artery without angina pectoris: Secondary | ICD-10-CM | POA: Diagnosis not present

## 2017-03-17 LAB — MYOCARDIAL PERFUSION IMAGING
CHL CUP MPHR: 149 {beats}/min
CHL CUP NUCLEAR SDS: 2
CSEPED: 8 min
Estimated workload: 10.1 METS
Exercise duration (sec): 0 s
LHR: 0.26
LVDIAVOL: 53 mL (ref 46–106)
LVSYSVOL: 17 mL
NUC STRESS TID: 0.78
Peak HR: 133 {beats}/min
Percent HR: 89 %
Rest HR: 70 {beats}/min
SRS: 0
SSS: 2

## 2017-03-17 MED ORDER — TECHNETIUM TC 99M TETROFOSMIN IV KIT
10.5000 | PACK | Freq: Once | INTRAVENOUS | Status: AC | PRN
  Administered 2017-03-17: 10.5 via INTRAVENOUS
  Filled 2017-03-17: qty 11

## 2017-03-17 MED ORDER — TECHNETIUM TC 99M TETROFOSMIN IV KIT
32.3000 | PACK | Freq: Once | INTRAVENOUS | Status: AC | PRN
  Administered 2017-03-17: 32.3 via INTRAVENOUS
  Filled 2017-03-17: qty 33

## 2017-03-19 ENCOUNTER — Telehealth: Payer: Self-pay | Admitting: Interventional Cardiology

## 2017-03-19 DIAGNOSIS — E782 Mixed hyperlipidemia: Secondary | ICD-10-CM

## 2017-03-19 NOTE — Telephone Encounter (Signed)
Spoke with pt and went over Myoview and lab results.  Pt states that last week her PCP increased her Crestor to 10mg  once daily.  Pt does not want to take 40mg  at this time.  She states last time she jumped up to a higher dose quickly that she suffered from amnesia.  Pt did not have f/u labs scheduled with PCP but was agreeable to have labs drawn in our office on 04/22/17.  Pt verbalized understanding and was appreciative for call.

## 2017-03-19 NOTE — Telephone Encounter (Signed)
New message  Pt verbalized that she is calling for the RN  For results on test 03/17/2017

## 2017-03-20 DIAGNOSIS — H02831 Dermatochalasis of right upper eyelid: Secondary | ICD-10-CM | POA: Diagnosis not present

## 2017-03-20 DIAGNOSIS — H2513 Age-related nuclear cataract, bilateral: Secondary | ICD-10-CM | POA: Diagnosis not present

## 2017-03-20 DIAGNOSIS — H524 Presbyopia: Secondary | ICD-10-CM | POA: Diagnosis not present

## 2017-03-20 DIAGNOSIS — Z1231 Encounter for screening mammogram for malignant neoplasm of breast: Secondary | ICD-10-CM | POA: Diagnosis not present

## 2017-03-20 DIAGNOSIS — H02832 Dermatochalasis of right lower eyelid: Secondary | ICD-10-CM | POA: Diagnosis not present

## 2017-03-20 DIAGNOSIS — H25013 Cortical age-related cataract, bilateral: Secondary | ICD-10-CM | POA: Diagnosis not present

## 2017-04-22 ENCOUNTER — Other Ambulatory Visit: Payer: Medicare Other | Admitting: *Deleted

## 2017-04-22 DIAGNOSIS — E782 Mixed hyperlipidemia: Secondary | ICD-10-CM

## 2017-04-22 LAB — LIPID PANEL
CHOLESTEROL TOTAL: 181 mg/dL (ref 100–199)
Chol/HDL Ratio: 2.5 ratio (ref 0.0–4.4)
HDL: 71 mg/dL (ref >39)
LDL CALC: 95 mg/dL (ref 0–99)
TRIGLYCERIDES: 74 mg/dL (ref 0–149)
VLDL Cholesterol Cal: 15 mg/dL (ref 5–40)

## 2017-04-22 LAB — HEPATIC FUNCTION PANEL
ALK PHOS: 79 IU/L (ref 39–117)
ALT: 14 IU/L (ref 0–32)
AST: 18 IU/L (ref 0–40)
Albumin: 4.3 g/dL (ref 3.5–4.8)
BILIRUBIN TOTAL: 0.5 mg/dL (ref 0.0–1.2)
BILIRUBIN, DIRECT: 0.17 mg/dL (ref 0.00–0.40)
Total Protein: 6.2 g/dL (ref 6.0–8.5)

## 2017-04-23 ENCOUNTER — Telehealth: Payer: Self-pay | Admitting: *Deleted

## 2017-04-23 DIAGNOSIS — E785 Hyperlipidemia, unspecified: Secondary | ICD-10-CM

## 2017-04-23 MED ORDER — ROSUVASTATIN CALCIUM 20 MG PO TABS: 20.0000 mg | ORAL_TABLET | Freq: Every day | ORAL | 3 refills | Status: AC

## 2017-04-23 NOTE — Telephone Encounter (Signed)
-----   Message from Belva Crome, MD sent at 04/22/2017 10:36 PM EDT ----- Let the patient know lipids are much better. Our target should be LDL < 70. If tolerating the Rosuva 10 mg, please increase to 20 mg daily and recheck lipid and liver in 2 months.If side effects, continue 10 mg. A copy will be sent to Lavone Orn, MD

## 2017-04-23 NOTE — Telephone Encounter (Signed)
Spoke with pt and went over results and recommendations per Dr.Smith.  Pt denies any sx on Crestor 10mg  and is agreeable to try the 20mg  dose.  Sent prescription to preferred pharmacy ([facility]).  Pt states she has plenty of 10mg  tabs and will take 2 until she receives new prescription.  Pt will come in for labs on 5/21.

## 2017-05-26 ENCOUNTER — Ambulatory Visit (INDEPENDENT_AMBULATORY_CARE_PROVIDER_SITE_OTHER): Payer: Medicare Other | Admitting: Pulmonary Disease

## 2017-05-26 ENCOUNTER — Encounter: Payer: Self-pay | Admitting: Pulmonary Disease

## 2017-05-26 VITALS — BP 122/80 | HR 77 | Ht 61.0 in | Wt 131.0 lb

## 2017-05-26 DIAGNOSIS — R059 Cough, unspecified: Secondary | ICD-10-CM

## 2017-05-26 DIAGNOSIS — R05 Cough: Secondary | ICD-10-CM | POA: Diagnosis not present

## 2017-05-26 DIAGNOSIS — I2510 Atherosclerotic heart disease of native coronary artery without angina pectoris: Secondary | ICD-10-CM | POA: Diagnosis not present

## 2017-05-26 NOTE — Patient Instructions (Signed)
I am sorry that your cough is worse Increase the gabapentin to 1 tablet twice daily Continue Chlor-Trimeton Follow-up in 6 months.

## 2017-05-26 NOTE — Progress Notes (Signed)
Misty Woods    [id number]    03/19/1945  Primary Care Physician:Griffin, Jenny Reichmann, MD  Referring Physician: Lavone Orn, MD [place] [employer] [place] [address], [place] [address]  Chief complaint:  Follow up for chronic cough.  HPI: Misty Woods is a 72 year old with history of chronic cough for the past 9 years. She was originally evaluated by Dr. Melvyn Novas in 2011 she was diagnosed with LPR, GERD and was placed on PPI twice a day and diet modification. This helped somewhat with her symptoms since 2017 she's noticed worsening cough. She was recently started on Flonase and Zantac was added to Protonix 40 mg twice a day. This has not changed his symptoms. We started her on chlorpheniramine, dymista, hycodan with no change in symptoms.   She has chronic daily hacking cough. This is worsened by cold air of water. She does not have any shortness of breath, wheezing. Seen by Dr. [name], ENT who did a laryngoscopy. As per the patient he did not find anything abnormal. He may consider doing a CT scan of the sinuses. She has stopped the dymista nasal spray since she feels it does not help. She continues on the Zyrtec. She was seen by GI by Dr. Rosalie Gums  and had a scope which showed multiple gastric polyps. She has a manometry study and followed up with GI at [facility].he was told that she does not have GERD and was taken off PPI.   She has been evaluated by Dr. [name] at the [facility]. It is noted that her symptoms worsened around the same time she got up a pet parrot. Workup for hypersensitivity serologies are negative  Interim history: We have been tapering down on gabapentin.  She states that the cough is back, not as worse as previously but she still has paroxysms of nonproductive cough She continues on the Chlor-Trimeton.  Outpatient Encounter Medications as of 05/26/2017  Medication Sig  . aspirin EC 81 MG tablet Take 81 mg by mouth daily.  . calcium citrate-vitamin D (CITRACAL+D)  315-200 MG-UNIT tablet Take by mouth.  . chlorpheniramine (CHLOR-TRIMETON) 4 MG tablet Take 8 mg by mouth 2 (two) times daily.  . Cholecalciferol (VITAMIN D3) 5000 units CAPS Take 1 capsule by mouth daily.  . cyanocobalamin (TH VITAMIN B12) 100 MCG tablet Take by mouth.  . gabapentin (NEURONTIN) 300 MG capsule Take 2 capsules (600 mg total) by mouth 2 (two) times daily.  Marland Kitchen levothyroxine (SYNTHROID, LEVOTHROID) 100 MCG tablet Take 100 mcg by mouth daily before breakfast.  . rosuvastatin (CRESTOR) 20 MG tablet Take 1 tablet (20 mg total) by mouth daily.  Marland Kitchen Respiratory Therapy Supplies (FLUTTER) DEVI Take as directed (Patient not taking: Reported on 05/26/2017)  . [DISCONTINUED] omeprazole (PRILOSEC) 40 MG capsule Take 40 mg by mouth daily.   No facility-administered encounter medications on file as of 05/26/2017.     Allergies as of 05/26/2017 - Review Complete 05/26/2017  Allergen Reaction Noted  . Propoxyphene hcl    . Tramadol hcl    . Propoxyphene Rash 02/11/2012    No past medical history on file.  Past Surgical History:  Procedure Laterality Date  . [place] STUDY N/A 07/29/2016   Procedure: [place] STUDY;  Surgeon: Otis Brace, MD;  Location: [facility];  Service: Gastroenterology;  Laterality: N/A;  . ESOPHAGEAL MANOMETRY N/A 07/29/2016   Procedure: ESOPHAGEAL MANOMETRY (EM);  Surgeon: Otis Brace, MD;  Location: [facility];  Service: Gastroenterology;  Laterality: N/A;  . VIDEO BRONCHOSCOPY Bilateral 04/11/2016   Procedure: VIDEO BRONCHOSCOPY WITHOUT FLUORO;  Surgeon: [name], MD;  Location: [facility];  Service: Cardiopulmonary;  Laterality: Bilateral;    Family History  Problem Relation Age of Onset  . Allergic rhinitis Brother   . Asthma Brother   . Angioedema Neg Hx   . Atopy Neg Hx   . Eczema Neg Hx   . Immunodeficiency Neg Hx     Social History   Socioeconomic History  . Marital status: Married    Spouse name: Not on file  . Number  of children: Not on file  . Years of education: Not on file  . Highest education level: Not on file  Occupational History  . Not on file  Social Needs  . Financial resource strain: Not on file  . Food insecurity:    Worry: Not on file    Inability: Not on file  . Transportation needs:    Medical: Not on file    Non-medical: Not on file  Tobacco Use  . Smoking status: Former Smoker    Packs/day: 1.00    Years: 5.00    Pack years: 5.00    Types: Cigarettes    Last attempt to quit: 02/05/1968    Years since quitting: 49.3  . Smokeless tobacco: Never Used  Substance and Sexual Activity  . Alcohol use: Yes    Comment: Social  . Drug use: No  . Sexual activity: Not on file  Lifestyle  . Physical activity:    Days per week: Not on file    Minutes per session: Not on file  . Stress: Not on file  Relationships  . Social connections:    Talks on phone: Not on file    Gets together: Not on file    Attends religious service: Not on file    Active member of club or organization: Not on file    Attends meetings of clubs or organizations: Not on file    Relationship status: Not on file  . Intimate partner violence:    Fear of current or ex partner: Not on file    Emotionally abused: Not on file    Physically abused: Not on file    Forced sexual activity: Not on file  Other Topics Concern  . Not on file  Social History Narrative  . Not on file    Review of systems: Review of Systems  Constitutional: Negative for fever and chills.  HENT: Negative.   Eyes: Negative for blurred vision.  Respiratory: as per HPI  Cardiovascular: Negative for chest pain and palpitations.  Gastrointestinal: Negative for vomiting, diarrhea, blood per rectum. Genitourinary: Negative for dysuria, urgency, frequency and hematuria.  Musculoskeletal: Negative for myalgias, back pain and joint pain.  Skin: Negative for itching and rash.  Neurological: Negative for dizziness, tremors, focal weakness,  seizures and loss of consciousness.  Endo/Heme/Allergies: Negative for environmental allergies.  Psychiatric/Behavioral: Negative for depression, suicidal ideas and hallucinations.  All other systems reviewed and are negative.  Physical Exam: Blood pressure 122/80, pulse 77, height 5\' 1"  (1.549 m), weight 131 lb (59.4 kg), SpO2 97 %. Gen:      No acute distress HEENT:  EOMI, sclera anicteric Neck:     No masses; no thyromegaly Lungs:    Clear to auscultation bilaterally; normal respiratory effort CV:         Regular rate and rhythm; no murmurs Abd:      + bowel sounds; soft,  non-tender; no palpable masses, no distension Ext:    No edema; adequate peripheral perfusion Skin:      Warm and dry; no rash Neuro: alert and oriented x 3 Psych: normal mood and affect  Data Reviewed: CXR 10/06/15- No acute abnormalities. Images reviewed. High res CT of chest 12/05/15-no evidence of interstitial lung disease. No pulmonary findings to explain patient's cough. Aortic atherosclerosis. Coronary artery calcifications.  I have reviewed all images personally   FENO 10/25/15- 19 FENO 01/06/17- 14  PFTs 12/11/15 FVC 2.72 (5%) FEV1 1.89 (97%) next and/F 69 Minimal obstructive lung disease. No broncho-dilator response. Methacholine challenge test negative.  Bronchocoscopy 04/11/16-normal airway inspection  Assessment:  Refractory chronic cough. Upper airway cough from post nasal drip vs neurogenic cough Her cough has been refractory to multiple interventions including treatment for postnasal drip, GERD.  She was evaluated by ENT and no abnormalities were found. She had a GI evaluation with a mano that apparently showed reflux but she cannot tolerate maximal acid suppression as she has developed gastric polyps that was though to be secondary to PPI. The symptoms are not very typical of asthma and FENO is low. Bronchoscopy, CT scan of the chest is normal and PFTs do not show any response to methacholine  challenge. So far we have eliminated all obvious lung etiologies of cough.    She had a good response earlier this year however the cough is back as the gabapentin is being tapered off Stop gabapentin taper.  Increase gabapentin dose to 300 mg twice daily Continue Chlor-Trimeton  GERD, Gastric polyps Off PPI per her GI doctor at [facility]. She was told that she does not have acid reflux.   Plan/Recommend ations: - Increase gabapentin to 300 mg twice daily - Continue chlorpheniramine at current dose (8 mg bid)   [name] MD [facility] 05/26/2017, 11:09 AM  CC: Lavone Orn, MD

## 2017-06-23 ENCOUNTER — Encounter: Payer: Self-pay | Admitting: Pulmonary Disease

## 2017-06-23 NOTE — Telephone Encounter (Signed)
RB please advise. Thanks.  

## 2017-06-23 NOTE — Telephone Encounter (Signed)
Called and spoke with pt in regards to message pt sent to [place] via mychart. Pt last saw Dr. [name] 05/26/17 and at this visit, plan was to increase pt's gabapentin to 300mg  twice daily.  Pt states her cough had been doing better since that visit but then cough became worse again. Pt states over the last 10 days, her her cough has become worse. Pt states her cough is a dry cough.  Per pt, she is wanting to know if the gabapentin can be increased from twice daily to three times daily.  Due to Dr. [name] being out of the office, routing message to DOD.  Dr. Lamonte Sakai, please advise if we can increase pt's gabapentin from twice daily to three times daily, or if pt should come in for a visit first to discuss this.  Thanks!

## 2017-06-24 ENCOUNTER — Other Ambulatory Visit: Payer: Medicare Other

## 2017-06-24 DIAGNOSIS — E785 Hyperlipidemia, unspecified: Secondary | ICD-10-CM | POA: Diagnosis not present

## 2017-06-24 LAB — LIPID PANEL
CHOL/HDL RATIO: 2.3 ratio (ref 0.0–4.4)
CHOLESTEROL TOTAL: 168 mg/dL (ref 100–199)
HDL: 74 mg/dL (ref >39)
LDL Calculated: 78 mg/dL (ref 0–99)
Triglycerides: 78 mg/dL (ref 0–149)
VLDL Cholesterol Cal: 16 mg/dL (ref 5–40)

## 2017-06-24 LAB — HEPATIC FUNCTION PANEL
ALBUMIN: 4.6 g/dL (ref 3.5–4.8)
ALK PHOS: 77 IU/L (ref 39–117)
ALT: 18 IU/L (ref 0–32)
AST: 20 IU/L (ref 0–40)
BILIRUBIN, DIRECT: 0.16 mg/dL (ref 0.00–0.40)
Bilirubin Total: 0.5 mg/dL (ref 0.0–1.2)
TOTAL PROTEIN: 6.4 g/dL (ref 6.0–8.5)

## 2017-06-24 NOTE — Telephone Encounter (Signed)
A review of the chart suggests that she has GERD, but has been unable to tolerate maximal GERD therapy due to side effects. Unclear why she is worse over the last 10 days. I would defer changing the gabapentin to Dr [name]. Continue the chlorpheniramine. Offer her tessalon perles to try to manage sx, see if this flare resolves. She needs to call us next week to update [place] on her status.

## 2017-07-02 ENCOUNTER — Encounter: Payer: Self-pay | Admitting: Pulmonary Disease

## 2017-07-02 DIAGNOSIS — S93402A Sprain of unspecified ligament of left ankle, initial encounter: Secondary | ICD-10-CM | POA: Diagnosis not present

## 2017-07-02 DIAGNOSIS — R600 Localized edema: Secondary | ICD-10-CM | POA: Diagnosis not present

## 2017-07-02 DIAGNOSIS — M25472 Effusion, left ankle: Secondary | ICD-10-CM | POA: Diagnosis not present

## 2017-07-03 NOTE — Telephone Encounter (Signed)
Called and spoke with pt in regards to message pt sent to [place] via mychart. Pt last saw Dr. [name] 05/26/17 and at this visit, plan was to increase pt's gabapentin to 300mg  twice daily.  Pt states her cough had been doing better since that visit but then cough became worse again. Pt states over the last 10 days, her her cough has become worse. Pt states her cough is a dry cough.  Per pt, she is wanting to know if the gabapentin can be increased from twice daily to three times daily.  Due to Dr. [name] being out of the office, routing message to DOD.  Dr. Lamonte Sakai, please advise if we can increase pt's gabapentin from twice daily to three times daily, or if pt should come in for a visit first to discuss this.  Thanks!   Response from Dr. Lamonte Sakai:   A review of the chart suggests that she has GERD, but has been unable to tolerate maximal GERD therapy due to side effects. Unclear why she is worse over the last 10 days. I would defer changing the gabapentin to Dr [name]. Continue the chlorpheniramine. Offer her tessalon perles to try to manage sx, see if this flare resolves. She needs to call us next week to update [place] on her status.   Dr. [name], please advise. Thanks!

## 2017-07-03 NOTE — Telephone Encounter (Signed)
Hello Mrs Hannold  Yes. It is ok to increase gabapentin to 300 mg tid. No need for office visit. We can follow up as previously scheduled.   [name] MD [facility] 07/03/2017, 3:21 PM

## 2017-07-04 ENCOUNTER — Encounter: Payer: Self-pay | Admitting: Pulmonary Disease

## 2017-07-04 NOTE — Telephone Encounter (Signed)
Patient sent email about dosage of gabapentin. Confirmed dose and emailed patient. Nothing further is needed.   Hi Ms Santucci,  Yes take the gabapentin 300 mg 3 times a day.  Thank you,   Pulmonary   ===View-only below this line===   ----- Message -----    From: Misty Woods    Sent: 07/04/2017 10:52 AM EDT      To: [name], MD Subject: Non-Urgent Medical Question  Just to confirm, I should take one 300mg  morning, noon and night?

## 2017-07-28 DIAGNOSIS — M65331 Trigger finger, right middle finger: Secondary | ICD-10-CM | POA: Diagnosis not present

## 2017-07-28 DIAGNOSIS — M65341 Trigger finger, right ring finger: Secondary | ICD-10-CM | POA: Diagnosis not present

## 2017-08-25 DIAGNOSIS — M65331 Trigger finger, right middle finger: Secondary | ICD-10-CM | POA: Diagnosis not present

## 2017-08-25 DIAGNOSIS — M65341 Trigger finger, right ring finger: Secondary | ICD-10-CM | POA: Diagnosis not present

## 2017-09-05 DIAGNOSIS — T148XXA Other injury of unspecified body region, initial encounter: Secondary | ICD-10-CM | POA: Diagnosis not present

## 2017-09-10 DIAGNOSIS — S61206A Unspecified open wound of right little finger without damage to nail, initial encounter: Secondary | ICD-10-CM | POA: Diagnosis not present

## 2017-09-24 ENCOUNTER — Other Ambulatory Visit: Payer: Self-pay | Admitting: Gastroenterology

## 2017-09-24 DIAGNOSIS — K317 Polyp of stomach and duodenum: Secondary | ICD-10-CM | POA: Diagnosis not present

## 2017-09-24 DIAGNOSIS — R1310 Dysphagia, unspecified: Secondary | ICD-10-CM | POA: Diagnosis not present

## 2017-09-24 DIAGNOSIS — Z8719 Personal history of other diseases of the digestive system: Secondary | ICD-10-CM | POA: Diagnosis not present

## 2017-09-24 DIAGNOSIS — Z8371 Family history of colonic polyps: Secondary | ICD-10-CM | POA: Diagnosis not present

## 2017-09-24 DIAGNOSIS — Z86010 Personal history of colonic polyps: Secondary | ICD-10-CM | POA: Diagnosis not present

## 2017-09-24 DIAGNOSIS — R05 Cough: Secondary | ICD-10-CM | POA: Diagnosis not present

## 2017-09-29 ENCOUNTER — Ambulatory Visit
Admission: RE | Admit: 2017-09-29 | Discharge: 2017-09-29 | Disposition: A | Discharge status: Home / Self Care | Payer: Medicare Other | Source: Ambulatory Visit | Attending: Gastroenterology | Admitting: Gastroenterology

## 2017-09-29 DIAGNOSIS — K449 Diaphragmatic hernia without obstruction or gangrene: Secondary | ICD-10-CM | POA: Diagnosis not present

## 2017-09-29 DIAGNOSIS — R1310 Dysphagia, unspecified: Secondary | ICD-10-CM | POA: Diagnosis not present

## 2017-10-03 DIAGNOSIS — Z4789 Encounter for other orthopedic aftercare: Secondary | ICD-10-CM | POA: Diagnosis not present

## 2017-10-29 DIAGNOSIS — L910 Hypertrophic scar: Secondary | ICD-10-CM | POA: Diagnosis not present

## 2017-10-29 DIAGNOSIS — D489 Neoplasm of uncertain behavior, unspecified: Secondary | ICD-10-CM | POA: Diagnosis not present

## 2017-11-04 DIAGNOSIS — Z23 Encounter for immunization: Secondary | ICD-10-CM | POA: Diagnosis not present

## 2017-12-01 ENCOUNTER — Telehealth: Payer: Self-pay | Admitting: Pulmonary Disease

## 2017-12-01 DIAGNOSIS — M65331 Trigger finger, right middle finger: Secondary | ICD-10-CM | POA: Diagnosis not present

## 2017-12-01 NOTE — Telephone Encounter (Signed)
Per Dr. Charlane Ferretti see if pt can come in early for 12:00 OV on 12/04/17.  10:00 and 10:30 is currently open lmtcb x1 for pt.  PLEASE BLOCK 12:00 slot once pt has rescheduled.

## 2017-12-01 NOTE — Telephone Encounter (Signed)
Pt has been scheduled for 10:00 on 12/04/17. Nothing further is needed.

## 2017-12-04 ENCOUNTER — Encounter: Payer: Self-pay | Admitting: Pulmonary Disease

## 2017-12-04 ENCOUNTER — Ambulatory Visit (INDEPENDENT_AMBULATORY_CARE_PROVIDER_SITE_OTHER): Payer: Medicare Other | Admitting: Pulmonary Disease

## 2017-12-04 ENCOUNTER — Ambulatory Visit: Payer: Medicare Other | Admitting: Pulmonary Disease

## 2017-12-04 ENCOUNTER — Other Ambulatory Visit: Payer: Self-pay | Admitting: Orthopedic Surgery

## 2017-12-04 VITALS — BP 124/76 | HR 77 | Ht 61.0 in | Wt 137.0 lb

## 2017-12-04 DIAGNOSIS — R05 Cough: Secondary | ICD-10-CM

## 2017-12-04 DIAGNOSIS — I2510 Atherosclerotic heart disease of native coronary artery without angina pectoris: Secondary | ICD-10-CM | POA: Diagnosis not present

## 2017-12-04 DIAGNOSIS — R059 Cough, unspecified: Secondary | ICD-10-CM

## 2017-12-04 LAB — NITRIC OXIDE: Nitric Oxide: 15

## 2017-12-04 NOTE — Progress Notes (Signed)
Misty Woods    [id number]    1945/10/12  Primary Care Physician:Griffin, Jenny Reichmann, MD  Referring Physician: Lavone Orn, MD [place] [employer] [place] [address], [place] [address]  Chief complaint:  Follow up for chronic cough.  HPI: Misty Woods is a 72 year old with history of chronic cough for over 10 years. She was originally evaluated by Dr. Melvyn Novas in 2011 she was diagnosed with LPR, GERD and was placed on PPI twice a day and diet modification. This helped somewhat with her symptoms since 2017 she's noticed worsening cough. She was recently started on Flonase and Zantac was added to Protonix 40 mg twice a day. This has not changed his symptoms. We started her on chlorpheniramine, dymista, hycodan with no change in symptoms.   She has chronic daily hacking cough. This is worsened by cold air of water. She does not have any shortness of breath, wheezing. Seen by Dr. [name], ENT who did a laryngoscopy. As per the patient he did not find anything abnormal. She has stopped the dymista nasal spray since she feels it does not help. She continues on the Zyrtec. She was seen by GI by Dr. Rosalie Gums  and had a scope which showed multiple gastric polyps. She has a manometry study and followed up with GI at [facility].he was told that she does not have GERD and was taken off PPI.   She has been evaluated by Dr. [name] at the [facility]. It is noted that her symptoms worsened around the same time she got up a pet parrot. Workup for hypersensitivity serologies are negative  Interim history: She has had a brief period when cough improved over the past few months but has returned back to baseline   Continues on Chlor-Trimeton and gabapentin.  She is taking the gabapentin 300 mg in the morning and 600 mg at night.  Outpatient Encounter Medications as of 12/04/2017  Medication Sig  . aspirin EC 81 MG tablet Take 81 mg by mouth daily.  . calcium citrate-vitamin D (CITRACAL+D) 315-200 MG-UNIT tablet  Take by mouth.  . chlorpheniramine (CHLOR-TRIMETON) 4 MG tablet Take 8 mg by mouth 2 (two) times daily.  . Cholecalciferol (VITAMIN D3) 5000 units CAPS Take 1 capsule by mouth daily.  . cyanocobalamin (TH VITAMIN B12) 100 MCG tablet Take by mouth.  . gabapentin (NEURONTIN) 300 MG capsule Take 2 capsules (600 mg total) by mouth 2 (two) times daily.  Marland Kitchen levothyroxine (SYNTHROID, LEVOTHROID) 100 MCG tablet Take 100 mcg by mouth daily before breakfast.  . rosuvastatin (CRESTOR) 20 MG tablet Take 1 tablet (20 mg total) by mouth daily.  Marland Kitchen Respiratory Therapy Supplies (FLUTTER) DEVI Take as directed (Patient not taking: Reported on 12/04/2017)   No facility-administered encounter medications on file as of 12/04/2017.    Physical Exam: Blood pressure 122/80, pulse 77, height 5\' 1"  (1.549 m), weight 131 lb (59.4 kg), SpO2 97 %. Gen:      No acute distress HEENT:  EOMI, sclera anicteric Neck:     No masses; no thyromegaly Lungs:    Clear to auscultation bilaterally; normal respiratory effort CV:         Regular rate and rhythm; no murmurs Abd:      + bowel sounds; soft, non-tender; no palpable masses, no distension Ext:    No edema; adequate peripheral perfusion Skin:      Warm and dry; no rash Neuro: alert and oriented x 3 Psych: normal mood and affect  Data Reviewed: Imaging CXR 10/06/15- No acute abnormalities. Images reviewed. High res CT of chest 12/05/15-no evidence of interstitial lung disease. No pulmonary findings to explain patient's cough. Aortic atherosclerosis. Coronary artery calcifications.  CT cardiac 01/17/2017- renal artery calcifications along the left anterior descending, left circumflex and right coronary artery.  Small pulmonary nodules at the lung bases with minimal change compared to 2017. I have reviewed the images personally  PFTs 12/11/15 FVC 2.72 (5%) FEV1 1.89 (97%) next and/F 69 Minimal obstructive lung disease. No broncho-dilator response. Methacholine challenge  test negative.  FENO 10/25/15- 19 FENO 01/06/17- 14 FENO 12/04/2017-50  Bronchocoscopy 04/11/16-normal airway inspection  GI studies 07/29/16 esophageal manometry: normal LES resting pressure and IRP, 3 failed swallows, 3 normal swallows, 4 weak swallows  07/29/16 pH-Z study (performed off PPI therapy): elevated total and supine distal acid exposure (9/7% and 16.8, respectively), with normal supine acid exposure (0.1%). 16 total reflux episodes (normal). It appears there are multiple prolonged upright acid exposure events present. 40 cough episodes reported, with a symptom index of 40% (16/40) and SAP of 99.9%  Esophagram 09/29/2017 Nonspecific esophageal motility disorder, small to moderate hiatal hernia with GE reflux.  Mild narrowing at the GE junction.  No esophageal mass.  Assessment:  Refractory chronic cough. Upper airway cough from post nasal drip vs neurogenic cough Her cough has been refractory to multiple interventions including treatment for postnasal drip, GERD.  She was evaluated by ENT and no abnormalities were found. She had a GI evaluation with a mano that apparently showed reflux but she cannot tolerate maximal acid suppression as she has developed gastric polyps that was though to be secondary to PPI. The symptoms are not very typical of asthma and FENO is low. Bronchoscopy, CT scan of the chest is normal and PFTs do not show any response to methacholine challenge. So far we have eliminated all obvious lung etiologies of cough.    She had a good response earlier this year however the cough is back as the gabapentin is being tapered off Increase gabapentin to 600 mg twice daily Continue Chlor-Trimeton.  He has not tolerated codeine or tramadol due to side effects in the past I will refer her to Dr. Melvyn Novas my partner for second opinion for refractory cough.  I educated her on behavioral changes to deal with cough including conscious suppression of the urge to cough, use of throat  lozenges.  GERD, Gastric polyps Off PPI per her GI doctor at [facility]. She was told that she does not have acid reflux.   Plan/Recommend ations: - Increase gabapentin to 600 mg twice daily - Continue chlorpheniramine at current dose (8 mg bid) - Second opinion from Dr. Michiel Sites MD [facility] 12/04/2017, 9:51 AM  CC: Lavone Orn, MD

## 2017-12-04 NOTE — Patient Instructions (Addendum)
Increase gabapentin to 600 mg twice daily Continue Chlor-Trimeton  You need to try to suppress your cough to allow your larynx (voice box) to heal.  For three days don't talk, laugh, sing, or clear your throat. Do everything you can to suppress the cough during this time. Use hard candies (sugarless Jolly Ranchers) or non-mint or non-menthol containing cough drops during this time to soothe your throat.  Use a cough suppressant (Delsym or what I have prescribed you) around the clock during this time.  After three days, gradually increase the use of your voice and back off on the cough suppressants.

## 2017-12-05 ENCOUNTER — Telehealth: Payer: Self-pay | Admitting: *Deleted

## 2017-12-05 ENCOUNTER — Other Ambulatory Visit: Payer: Self-pay

## 2017-12-05 ENCOUNTER — Encounter (HOSPITAL_BASED_OUTPATIENT_CLINIC_OR_DEPARTMENT_OTHER): Payer: Self-pay | Admitting: *Deleted

## 2017-12-05 DIAGNOSIS — M65841 Other synovitis and tenosynovitis, right hand: Secondary | ICD-10-CM

## 2017-12-05 HISTORY — DX: Other synovitis and tenosynovitis, right hand: M65.841

## 2017-12-05 NOTE — Telephone Encounter (Signed)
   Primary Cardiologist:Henry Nicholes Stairs III, MD  Chart reviewed as part of pre-operative protocol coverage. Because of Misty Woods's past medical history and time since last visit, he/she will require a follow-up visit in order to better assess preoperative cardiovascular risk.  Pre-op covering staff: - Please schedule appointment and call patient to inform them. - Please contact requesting surgeon's office via preferred method (i.e, phone, fax) to inform them of need for appointment prior to surgery.  Dr. Tamala Julian, Is it okay to hold ASA for 5 days if doing well during office visit? Please forward your response to P CV DIV PREOP.   Thank you  Leanor Kail, PA  12/05/2017, 10:47 AM

## 2017-12-05 NOTE — Telephone Encounter (Signed)
Okay to hold aspirin for 5 days 

## 2017-12-05 NOTE — Telephone Encounter (Signed)
   [facility] Pre-operative Risk Assessment    Request for surgical clearance:  1. What type of surgery is being performed? RELEASE A-1 PULLEY RIGHT MIDDLE FINGER   2. When is this surgery scheduled?  12/11/17   3. What type of clearance is required (medical clearance vs. Pharmacy clearance to hold med vs. Both)? BOTH  4. Are there any medications that need to be held prior to surgery and how long? aspirin   5. Practice name and name of physician performing surgery? [facility]   6. What is your office phone number [phone]    7.   What is your office fax number [phone]  8.   Anesthesia type (None, local, MAC, general) ?  IV REGIONAL   Jeanann Lewandowsky 12/05/2017, 8:38 AM  _________________________________________________________________   (provider comments below)

## 2017-12-09 NOTE — Telephone Encounter (Signed)
Pt is scheduled to come in and see Truitt Merle, NP on 12/10/17, clearance will be addressed at visit

## 2017-12-09 NOTE — Progress Notes (Signed)
CARDIOLOGY OFFICE NOTE  Date:  12/10/2017    Talbert Cage Date of Birth: 1945/02/13 Medical Record #[id number]  PCP:  Lavone Orn, MD  Cardiologist:  Tamala Julian   Chief Complaint  Patient presents with  . Pre-op Exam    Seen for Dr. Tamala Julian    History of Present Illness: Misty Woods is a 72 y.o. female who presents today for a pre op clearance visit. Seen for Dr. Tamala Julian.   She was referred here back in February for coronary calcification noted on CT scoring by her PCP and HLD. 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 FH + for CAD, HLD and rhythm issues.   Comes in today. Here alone. Needing finger surgery (trigger finger) - scheduled for tomorrow. Aspirin is to be held for 5 days per Dr. Tamala Julian - she already held as of last week. She feels fine. No chest pain. Breathing is good. Walks most days of the week. Has 4 flights of stairs in her home that she is going up and down multiple times during the day. She is tolerating her statin at this point. She has a chronic "idiopathic" cough.   Past Medical History:  Diagnosis Date  . Chronic cough    states "idiopathic"  . Dental crowns present    x 2  . High cholesterol   . Hypothyroidism   . Stenosing tenosynovitis of finger of right hand 12/2017   right third finger    Past Surgical History:  Procedure Laterality Date  . [place] STUDY N/A 07/29/2016   Procedure: [place] STUDY;  Surgeon: Otis Brace, MD;  Location: [facility];  Service: Gastroenterology;  Laterality: N/A;  . ABDOMINAL HYSTERECTOMY     complete  . APPENDECTOMY    . ESOPHAGEAL MANOMETRY N/A 07/29/2016   Procedure: ESOPHAGEAL MANOMETRY (EM);  Surgeon: Otis Brace, MD;  Location: [facility];  Service: Gastroenterology;  Laterality: N/A;  . THYROIDECTOMY  06/10/2005  . THYROIDECTOMY, PARTIAL Left 1998  . TONSILLECTOMY AND ADENOIDECTOMY    . VIDEO BRONCHOSCOPY Bilateral 04/11/2016   Procedure: VIDEO  BRONCHOSCOPY WITHOUT FLUORO;  Surgeon: [name], MD;  Location: [facility];  Service: Cardiopulmonary;  Laterality: Bilateral;     Medications: Current Meds  Medication Sig  . aspirin EC 81 MG tablet Take 81 mg by mouth daily.  . calcium citrate-vitamin D (CITRACAL+D) 315-200 MG-UNIT tablet Take by mouth.  . chlorpheniramine (CHLOR-TRIMETON) 4 MG tablet Take 8 mg by mouth 2 (two) times daily.  . Cholecalciferol (VITAMIN D3) 5000 units CAPS Take 1 capsule by mouth daily.  . cyanocobalamin (TH VITAMIN B12) 100 MCG tablet Take by mouth.  . gabapentin (NEURONTIN) 300 MG capsule Take 2 capsules (600 mg total) by mouth 2 (two) times daily.  Marland Kitchen levothyroxine (SYNTHROID, LEVOTHROID) 100 MCG tablet Take 100 mcg by mouth daily before breakfast.  . rosuvastatin (CRESTOR) 20 MG tablet Take 1 tablet (20 mg total) by mouth daily.     Allergies: Allergies  Allergen Reactions  . Tramadol Hcl Other (See Comments)    DIZZINESS  . Darvon [Propoxyphene] Rash    Social History: The patient  reports that she quit smoking about 48 years ago. Her smoking use included cigarettes. She has never used smokeless tobacco. She reports that she drinks alcohol. She reports that she does not use drugs.   Family History: The patient's family history includes Allergic rhinitis in her brother; Asthma in her brother.  Review of Systems: Please see the history of present illness.   Otherwise, the review of systems is positive for none.   All other systems are reviewed and negative.   Physical Exam: VS:  BP (!) 144/88   Pulse 83   Ht 5' 1.5" (1.562 m)   Wt 136 lb 12.8 oz (62.1 kg)   SpO2 98%   BMI 25.43 kg/m  .  BMI Body mass index is 25.43 kg/m.  Wt Readings from Last 3 Encounters:  12/10/17 136 lb 12.8 oz (62.1 kg)  12/04/17 137 lb (62.1 kg)  05/26/17 131 lb (59.4 kg)   [place] recheck by me is 23762  General: Pleasant. Well developed, well nourished and in no acute distress.   HEENT: Normal.    Neck: Supple, no JVD, carotid bruits, or masses noted.  Cardiac: Regular rate and rhythm. No murmurs, rubs, or gallops. No edema.  Respiratory:  Lungs are clear to auscultation bilaterally with normal work of breathing.  GI: Soft and nontender.  MS: No deformity or atrophy. Gait and ROM intact.  Skin: Warm and dry. Color is normal.  Neuro:  Strength and sensation are intact and no gross focal deficits noted.  Psych: Alert, appropriate and with normal affect.   LABORATORY DATA:  EKG:  EKG is ordered today. This demonstrates NSR with nonspecific changes - unchanged.   Lab Results  Component Value Date   CHOL 168 06/24/2017   TRIG 78 06/24/2017   HDL 74 06/24/2017   LDLCALC 78 06/24/2017   ALT 18 06/24/2017   AST 20 06/24/2017       BNP (last 3 results) No results for input(s): BNP in the last 8760 hours.  ProBNP (last 3 results) No results for input(s): PROBNP in the last 8760 hours.   Other Studies Reviewed Today:  Myoview Study Highlights 03/2017    Nuclear stress EF: 68%.  There was no ST segment deviation noted during stress.  The study is normal.  This is a low risk study. No perfusion defects indicative of ischemia.   Candee Furbish, MD   CT CARDIAC SCORING ADDENDUM 01/2017: Coronary artery calcifications involving the left anterior descending, left circumflex and the right coronary artery. There appears to be a small amount of calcified plaque involving the left main coronary artery.   Electronically Signed   By: Markus Daft M.D.   On: 01/20/2017 09:53    Assessment/Plan:  1. Pre op clearance - low risk surgery. No cardiac symptoms. Can do over 4 mets of activity without any issue. She is felt to be low risk. She has already held her aspirin since last week.   2. Coronary calcification on CT scan - on statin, needs aggressive CV risk factor modification.   3. HLD - now on statin - PCP to be checking labs per patient.   4. +FH for  CAD  Current medicines are reviewed with the patient today.  The patient does not have concerns regarding medicines other than what has been noted above.  The following changes have been made:  See above.  Labs/ tests ordered today include:   No orders of the defined types were placed in this encounter.    Disposition:   FU with [place] prn.   Patient is agreeable to this plan and will call if any problems develop in the interim.   SignedTruitt Merle, NP  12/10/2017 9:14 AM  [facility] [address] [place], [place]  [address]  Phone: [phone] Fax: [phone]

## 2017-12-09 NOTE — Telephone Encounter (Signed)
Attempted to reach pt no answer. Left message letting her know that we have openings tomorrow 12/09/17 and if she could call us back to make a appointment to get clearance for her surgery scheduled on 12/11/17

## 2017-12-10 ENCOUNTER — Encounter: Payer: Self-pay | Admitting: Nurse Practitioner

## 2017-12-10 ENCOUNTER — Ambulatory Visit (INDEPENDENT_AMBULATORY_CARE_PROVIDER_SITE_OTHER): Payer: Medicare Other | Admitting: Nurse Practitioner

## 2017-12-10 VITALS — BP 144/88 | HR 83 | Ht 61.5 in | Wt 136.8 lb

## 2017-12-10 DIAGNOSIS — I2510 Atherosclerotic heart disease of native coronary artery without angina pectoris: Secondary | ICD-10-CM

## 2017-12-10 DIAGNOSIS — Z01810 Encounter for preprocedural cardiovascular examination: Secondary | ICD-10-CM | POA: Diagnosis not present

## 2017-12-10 NOTE — Patient Instructions (Addendum)
We will be checking the following labs today - NONE    Medication Instructions:    Continue with your current medicines.    If you need a refill on your cardiac medications before your next appointment, please call your pharmacy.     Testing/Procedures To Be Arranged:  N/A  Follow-Up:   See [place] as needed.   I will send a note to Dr. Fredna Dow   At [facility], you and your health needs are our priority.  As part of our continuing mission to provide you with exceptional heart care, we have created designated Provider Care Teams.  These Care Teams include your primary Cardiologist (physician) and Advanced Practice Providers (APPs -  Physician Assistants and Nurse Practitioners) who all work together to provide you with the care you need, when you need it.  Special Instructions:  . None  Call the [facility] office at [phone] if you have any questions, problems or concerns.

## 2017-12-11 ENCOUNTER — Ambulatory Visit (HOSPITAL_BASED_OUTPATIENT_CLINIC_OR_DEPARTMENT_OTHER): Payer: Medicare Other | Admitting: Anesthesiology

## 2017-12-11 ENCOUNTER — Other Ambulatory Visit: Payer: Self-pay

## 2017-12-11 ENCOUNTER — Encounter (HOSPITAL_BASED_OUTPATIENT_CLINIC_OR_DEPARTMENT_OTHER)
Admission: RE | Disposition: A | Discharge status: Home / Self Care | Payer: Self-pay | Source: Ambulatory Visit | Attending: Orthopedic Surgery

## 2017-12-11 ENCOUNTER — Ambulatory Visit (HOSPITAL_BASED_OUTPATIENT_CLINIC_OR_DEPARTMENT_OTHER)
Admission: RE | Admit: 2017-12-11 | Discharge: 2017-12-11 | Disposition: A | Discharge status: Home / Self Care | Payer: Medicare Other | Source: Ambulatory Visit | Attending: Orthopedic Surgery | Admitting: Orthopedic Surgery

## 2017-12-11 ENCOUNTER — Encounter (HOSPITAL_BASED_OUTPATIENT_CLINIC_OR_DEPARTMENT_OTHER): Payer: Self-pay | Admitting: *Deleted

## 2017-12-11 DIAGNOSIS — M65331 Trigger finger, right middle finger: Secondary | ICD-10-CM | POA: Insufficient documentation

## 2017-12-11 DIAGNOSIS — Z87891 Personal history of nicotine dependence: Secondary | ICD-10-CM | POA: Insufficient documentation

## 2017-12-11 DIAGNOSIS — I2510 Atherosclerotic heart disease of native coronary artery without angina pectoris: Secondary | ICD-10-CM | POA: Insufficient documentation

## 2017-12-11 DIAGNOSIS — M65841 Other synovitis and tenosynovitis, right hand: Secondary | ICD-10-CM | POA: Diagnosis not present

## 2017-12-11 HISTORY — DX: Pure hypercholesterolemia, unspecified: E78.00

## 2017-12-11 HISTORY — DX: Dental restoration status: Z98.811

## 2017-12-11 HISTORY — DX: Other synovitis and tenosynovitis, right hand: M65.841

## 2017-12-11 HISTORY — PX: TRIGGER FINGER RELEASE: SHX641

## 2017-12-11 HISTORY — DX: Hypothyroidism, unspecified: E03.9

## 2017-12-11 HISTORY — DX: Cough: R05

## 2017-12-11 HISTORY — DX: Chronic cough: R05.3

## 2017-12-11 SURGERY — RELEASE, A1 PULLEY, FOR TRIGGER FINGER
Anesthesia: Monitor Anesthesia Care | Site: Hand | Laterality: Right

## 2017-12-11 MED ORDER — PHENYLEPHRINE 40 MCG/ML (10ML) SYRINGE FOR IV PUSH (FOR BLOOD PRESSURE SUPPORT)
PREFILLED_SYRINGE | INTRAVENOUS | Status: AC
  Filled 2017-12-11: qty 10

## 2017-12-11 MED ORDER — EPHEDRINE 5 MG/ML INJ
INTRAVENOUS | Status: AC
  Filled 2017-12-11: qty 10

## 2017-12-11 MED ORDER — MIDAZOLAM HCL 2 MG/2ML IJ SOLN
INTRAMUSCULAR | Status: AC
  Filled 2017-12-11: qty 2

## 2017-12-11 MED ORDER — MEPERIDINE HCL 25 MG/ML IJ SOLN: 6.2500 mg | INTRAMUSCULAR | Status: DC | PRN

## 2017-12-11 MED ORDER — LIDOCAINE 2% (20 MG/ML) 5 ML SYRINGE
INTRAMUSCULAR | Status: AC
  Filled 2017-12-11: qty 5

## 2017-12-11 MED ORDER — CEFAZOLIN SODIUM-DEXTROSE 2-4 GM/100ML-% IV SOLN
INTRAVENOUS | Status: AC
  Filled 2017-12-11: qty 100

## 2017-12-11 MED ORDER — ONDANSETRON HCL 4 MG/2ML IJ SOLN
INTRAMUSCULAR | Status: AC
  Filled 2017-12-11: qty 2

## 2017-12-11 MED ORDER — LACTATED RINGERS IV SOLN
INTRAVENOUS | Status: DC
  Administered 2017-12-11: 11:00:00 via INTRAVENOUS

## 2017-12-11 MED ORDER — SUCCINYLCHOLINE CHLORIDE 200 MG/10ML IV SOSY
PREFILLED_SYRINGE | INTRAVENOUS | Status: AC
  Filled 2017-12-11: qty 10

## 2017-12-11 MED ORDER — BUPIVACAINE HCL (PF) 0.25 % IJ SOLN
INTRAMUSCULAR | Status: AC
  Filled 2017-12-11: qty 30

## 2017-12-11 MED ORDER — FENTANYL CITRATE (PF) 100 MCG/2ML IJ SOLN: 25.0000 ug | INTRAMUSCULAR | Status: DC | PRN

## 2017-12-11 MED ORDER — HYDROCODONE-ACETAMINOPHEN 5-325 MG PO TABS: 1.0000 | ORAL_TABLET | Freq: Four times a day (QID) | ORAL | 0 refills | Status: DC | PRN

## 2017-12-11 MED ORDER — CEFAZOLIN SODIUM-DEXTROSE 2-4 GM/100ML-% IV SOLN
2.0000 g | INTRAVENOUS | Status: AC
  Administered 2017-12-11: 2 g via INTRAVENOUS

## 2017-12-11 MED ORDER — CHLORHEXIDINE GLUCONATE 4 % EX LIQD: 60.0000 mL | Freq: Once | CUTANEOUS | Status: DC

## 2017-12-11 MED ORDER — SCOPOLAMINE 1 MG/3DAYS TD PT72: 1.0000 | MEDICATED_PATCH | Freq: Once | TRANSDERMAL | Status: DC | PRN

## 2017-12-11 MED ORDER — LIDOCAINE HCL (PF) 0.5 % IJ SOLN
INTRAMUSCULAR | Status: DC | PRN
  Administered 2017-12-11: 30 mL via INTRAVENOUS

## 2017-12-11 MED ORDER — MIDAZOLAM HCL 2 MG/2ML IJ SOLN
1.0000 mg | INTRAMUSCULAR | Status: DC | PRN
  Administered 2017-12-11: 2 mg via INTRAVENOUS

## 2017-12-11 MED ORDER — FENTANYL CITRATE (PF) 100 MCG/2ML IJ SOLN
INTRAMUSCULAR | Status: AC
  Filled 2017-12-11: qty 2

## 2017-12-11 MED ORDER — BUPIVACAINE HCL (PF) 0.25 % IJ SOLN
INTRAMUSCULAR | Status: DC | PRN
  Administered 2017-12-11: 5 mL

## 2017-12-11 MED ORDER — FENTANYL CITRATE (PF) 100 MCG/2ML IJ SOLN
50.0000 ug | INTRAMUSCULAR | Status: DC | PRN
  Administered 2017-12-11 (×2): 50 ug via INTRAVENOUS

## 2017-12-11 MED ORDER — ONDANSETRON HCL 4 MG/2ML IJ SOLN
INTRAMUSCULAR | Status: DC | PRN
  Administered 2017-12-11: 4 mg via INTRAVENOUS

## 2017-12-11 SURGICAL SUPPLY — 35 items
BANDAGE COBAN STERILE 2 (GAUZE/BANDAGES/DRESSINGS) ×3 IMPLANT
BLADE SURG 15 STRL LF DISP TIS (BLADE) ×1 IMPLANT
BLADE SURG 15 STRL SS (BLADE) ×2
BNDG ESMARK 4X9 LF (GAUZE/BANDAGES/DRESSINGS) IMPLANT
CHLORAPREP W/TINT 26ML (MISCELLANEOUS) ×3 IMPLANT
CORD BIPOLAR FORCEPS 12FT (ELECTRODE) IMPLANT
COVER BACK TABLE 60X90IN (DRAPES) ×3 IMPLANT
COVER MAYO STAND STRL (DRAPES) ×3 IMPLANT
COVER WAND RF STERILE (DRAPES) IMPLANT
CUFF TOURNIQUET SINGLE 18IN (TOURNIQUET CUFF) ×3 IMPLANT
DECANTER SPIKE VIAL GLASS SM (MISCELLANEOUS) IMPLANT
DRAPE EXTREMITY T 121X128X90 (DRAPE) ×3 IMPLANT
DRAPE SURG 17X23 STRL (DRAPES) ×3 IMPLANT
GAUZE SPONGE 4X4 12PLY STRL (GAUZE/BANDAGES/DRESSINGS) ×3 IMPLANT
GAUZE XEROFORM 1X8 LF (GAUZE/BANDAGES/DRESSINGS) ×3 IMPLANT
GLOVE BIO SURGEON STRL SZ 6 (GLOVE) ×3 IMPLANT
GLOVE BIOGEL PI IND STRL 8 (GLOVE) ×1 IMPLANT
GLOVE BIOGEL PI IND STRL 8.5 (GLOVE) ×1 IMPLANT
GLOVE BIOGEL PI INDICATOR 8 (GLOVE) ×2
GLOVE BIOGEL PI INDICATOR 8.5 (GLOVE) ×2
GLOVE SURG ORTHO 8.0 STRL STRW (GLOVE) ×3 IMPLANT
GLOVE SURG SYN 8.0 (GLOVE) ×3 IMPLANT
GOWN STRL REIN XL XLG (GOWN DISPOSABLE) ×3 IMPLANT
GOWN STRL REUS W/ TWL LRG LVL3 (GOWN DISPOSABLE) IMPLANT
GOWN STRL REUS W/TWL LRG LVL3 (GOWN DISPOSABLE)
GOWN STRL REUS W/TWL XL LVL3 (GOWN DISPOSABLE) ×3 IMPLANT
NEEDLE PRECISIONGLIDE 27X1.5 (NEEDLE) ×3 IMPLANT
NS IRRIG 1000ML POUR BTL (IV SOLUTION) ×3 IMPLANT
PACK BASIN DAY SURGERY FS (CUSTOM PROCEDURE TRAY) ×3 IMPLANT
STOCKINETTE 4X48 STRL (DRAPES) ×3 IMPLANT
SUT ETHILON 4 0 PS 2 18 (SUTURE) ×3 IMPLANT
SYR BULB 3OZ (MISCELLANEOUS) ×3 IMPLANT
SYR CONTROL 10ML LL (SYRINGE) ×3 IMPLANT
TOWEL GREEN STERILE FF (TOWEL DISPOSABLE) ×6 IMPLANT
UNDERPAD 30X30 (UNDERPADS AND DIAPERS) ×3 IMPLANT

## 2017-12-11 NOTE — Brief Op Note (Signed)
12/11/2017  11:51 AM 2 PATIENT:  Misty Woods  72 y.o. female  PRE-OPERATIVE DIAGNOSIS:  STENOSING TENOSYNOVITIS RIGHT MIDDLE FINGER  POST-OPERATIVE DIAGNOSIS:  STENOSING TENOSYNOVITIS RIGHT MIDDLE FINGER  PROCEDURE:  Procedure(s): RELEASE TRIGGER FINGER/A-1 PULLEY RIGHT MIDDLE FINGER (Right)  SURGEON:  Surgeon(s) and Role:    * Daryll Brod, MD - Primary  PHYSICIAN ASSISTANT:   ASSISTANTS: none   ANESTHESIA:   local, regional and IV sedation  EBL: 81ml  BLOOD ADMINISTERED:none  DRAINS: none   LOCAL MEDICATIONS USED:  BUPIVICAINE   SPECIMEN:  No Specimen  DISPOSITION OF SPECIMEN:  N/A  COUNTS:  YES  TOURNIQUET:   Total Tourniquet Time Documented: Forearm (Right) - 16 minutes Total: Forearm (Right) - 16 minutes   DICTATION: .Dragon Dictation  PLAN OF CARE: Discharge to home after PACU  PATIENT DISPOSITION:  PACU - hemodynamically stable.

## 2017-12-11 NOTE — H&P (Signed)
  Misty Woods is an 72 y.o. female.   Chief Complaint: catching right middle finger HPI: Misty Woods is a 72yo female with triggering Right middle finger.  She has been seen earlier in the year for the trigger of her middle and ring fingers. She has had a each injected twice. The ring finger no longer triggers but she states her middle finger has begun triggering soon after her last visit. It was bad enough to wake her up in the middle of the night when it got caught in a flexed position and straighten. She states she has to take her opposite hand to straighten it out in the mornings. Significant pain when it occurs. No history of injury. He has no history of diabetes arthritis or gout. She does have a history of thyroid problems. Family history is negative for each.   Past Medical History:  Diagnosis Date  . Chronic cough    states "idiopathic"  . Dental crowns present    x 2  . High cholesterol   . Hypothyroidism   . Stenosing tenosynovitis of finger of right hand 12/2017   right third finger    Past Surgical History:  Procedure Laterality Date  . [place] STUDY N/A 07/29/2016   Procedure: [place] STUDY;  Surgeon: Otis Brace, MD;  Location: [facility];  Service: Gastroenterology;  Laterality: N/A;  . ABDOMINAL HYSTERECTOMY     complete  . APPENDECTOMY    . ESOPHAGEAL MANOMETRY N/A 07/29/2016   Procedure: ESOPHAGEAL MANOMETRY (EM);  Surgeon: Otis Brace, MD;  Location: [facility];  Service: Gastroenterology;  Laterality: N/A;  . THYROIDECTOMY  06/10/2005  . THYROIDECTOMY, PARTIAL Left 1998  . TONSILLECTOMY AND ADENOIDECTOMY    . VIDEO BRONCHOSCOPY Bilateral 04/11/2016   Procedure: VIDEO BRONCHOSCOPY WITHOUT FLUORO;  Surgeon: [name], MD;  Location: [facility];  Service: Cardiopulmonary;  Laterality: Bilateral;    Family History  Problem Relation Age of Onset  . Allergic rhinitis Brother   . Asthma Brother    Social History:  reports that she quit  smoking about 48 years ago. Her smoking use included cigarettes. She has never used smokeless tobacco. She reports that she drinks alcohol. She reports that she does not use drugs.  Allergies:  Allergies  Allergen Reactions  . Tramadol Hcl Other (See Comments)    DIZZINESS  . Darvon [Propoxyphene] Rash    No medications prior to admission.    No results found for this or any previous visit (from the past 48 hour(s)).  No results found.   Pertinent items are noted in HPI.  Height 5' 1.5" (1.562 m), weight 61.2 kg.  General appearance: alert, cooperative and appears stated age Head: Normocephalic, without obvious abnormality Neck: no JVD Resp: clear to auscultation bilaterally Cardio: regular rate and rhythm, S1, S2 normal, no murmur, click, rub or gallop GI: soft, non-tender; bowel sounds normal; no masses,  no organomegaly Extremities: catching right middle finger Pulses: 2+ and symmetric Skin: Skin color, texture, turgor normal. No rashes or lesions Neurologic: Grossly normal Incision/Wound: na  Assessment/Plan Assessment:  1. Trigger middle finger of right hand    Plan: She would like to have this released. She is aware that there is no guarantee with any surgery the possibility of infection recurrence injury to arteries nerves tendons complete relief symptoms dystrophy. She is scheduled for release A1 pulley right middle finger as an outpatient under regional anesthesia.   Daryll Brod 12/11/2017, 5:47 AM

## 2017-12-11 NOTE — Addendum Note (Signed)
Addended by: Jones Broom on: 12/11/2017 04:06 PM   Modules accepted: Orders

## 2017-12-11 NOTE — Op Note (Signed)
NAME: Misty Woods MEDICAL RECORD NO: [id number] DATE OF BIRTH: [birth year] FACILITY: [facility] LOCATION: [facility] PHYSICIAN: Wynonia Sours, MD   OPERATIVE REPORT   DATE OF PROCEDURE: 12/11/17    PREOPERATIVE DIAGNOSIS:   Stenosing tenosynovitis right middle finger   POSTOPERATIVE DIAGNOSIS:   Same   PROCEDURE:  Release A1 pulley right middle finger   SURGEON: Daryll Brod, M.D.   ASSISTANT: none   ANESTHESIA:  Regional with sedation   INTRAVENOUS FLUIDS:  Per anesthesia flow sheet.   ESTIMATED BLOOD LOSS:  Minimal.   COMPLICATIONS:  None.   SPECIMENS:  none   TOURNIQUET TIME:    Total Tourniquet Time Documented: Forearm (Right) - 16 minutes Total: Forearm (Right) - 16 minutes    DISPOSITION:  Stable to PACU.   INDICATIONS: Patient is a 72 year old female with a history of triggering of her right middle finger this is not responded to multiple injections.  She has elected to undergo surgical release of the A1 pulley.  Pre-peri-and postoperative course been discussed along with risk complications.  She is aware that there is no guarantee to the surgery the possibility of infection recurrence injury to arteries nerves tendons complete release symptoms dystrophy.  In the preoperative area the patient seen extremity marked by both patient and surgeon antibiotic given  OPERATIVE COURSE: She is brought to the operating room where a forearm-based IV regional anesthetic was carried out without difficulty.  She was prepped using ChloraPrep supine position with a right arm free.  A three-minute dry time was allowed timeout taken to confirm patient procedure.  An oblique incision was made over the A1 pulley after local infiltration quarter percent bupivacaine without epinephrine was given.  The dissection carried down to the A1 pulley.  Retractors were placed protecting neurovascular bundles radially and ulnarly.  The A1 pulley was released on its radial aspect a small  incision made centrally and A2.  Tenosynovial tissue proximally was separated.  The 2 tendons were separated with blunt dissection placed on full range of motion no further triggering was noted.  The wound was irrigated with saline.  The skin was closed with interrupted 4 nylon sutures.  Total amount of anesthetic injected was 5 cc.  Thorough compressive dressing with a finger screw was applied.  Inflation of the tourniquet all fingers immediately pink.  She was taken to the recovery room for observation in satisfactory condition.  She will be discharged home to return [facility] in 1 week on Tylenol ibuprofen with Norco as a back-up.  Daryll Brod, MD Electronically signed, 12/11/17

## 2017-12-11 NOTE — Anesthesia Preprocedure Evaluation (Addendum)
Anesthesia Evaluation  Patient identified by MRN, date of birth, ID band Patient awake    Reviewed: Allergy & Precautions, H&P , NPO status , Patient's Chart, lab work & pertinent test results, reviewed documented beta blocker date and time   Airway Mallampati: II  TM Distance: >3 FB Neck ROM: full    Dental no notable dental hx.    Pulmonary neg pulmonary ROS, former smoker,    Pulmonary exam normal breath sounds clear to auscultation       Cardiovascular Exercise Tolerance: Good + CAD   Rhythm:regular Rate:Normal     Neuro/Psych negative neurological ROS  negative psych ROS   GI/Hepatic Neg liver ROS,   Endo/Other  Hypothyroidism   Renal/GU negative Renal ROS  negative genitourinary   Musculoskeletal  (+) Arthritis , Osteoarthritis,    Abdominal   Peds  Hematology negative hematology ROS (+)   Anesthesia Other Findings   Reproductive/Obstetrics negative OB ROS                             Anesthesia Physical Anesthesia Plan  ASA: II  Anesthesia Plan: Bier Block and MAC and Bier Block-LIDOCAINE ONLY   Post-op Pain Management:    Induction:   PONV Risk Score and Plan: 2 and Ondansetron and Treatment may vary due to age or medical condition  Airway Management Planned: Nasal Cannula and Natural Airway  Additional Equipment:   Intra-op Plan:   Post-operative Plan:   Informed Consent: I have reviewed the patients History and Physical, chart, labs and discussed the procedure including the risks, benefits and alternatives for the proposed anesthesia with the patient or authorized representative who has indicated his/her understanding and acceptance.   Dental Advisory Given  Plan Discussed with: CRNA, Anesthesiologist and Surgeon  Anesthesia Plan Comments:         Anesthesia Quick Evaluation

## 2017-12-11 NOTE — Discharge Instructions (Addendum)

## 2017-12-11 NOTE — Transfer of Care (Signed)
Immediate Anesthesia Transfer of Care Note  Patient: Misty Woods  Procedure(s) Performed: RELEASE TRIGGER FINGER/A-1 PULLEY RIGHT MIDDLE FINGER (Right Hand)  Patient Location: PACU  Anesthesia Type:Bier block  Level of Consciousness: awake, alert , oriented and drowsy  Airway & Oxygen Therapy: Patient Spontanous Breathing and Patient connected to face mask oxygen  Post-op Assessment: Report given to RN and Post -op Vital signs reviewed and stable  Post vital signs: Reviewed and stable  Last Vitals:  Vitals Value Taken Time  BP 140/82 12/11/2017 11:53 AM  Temp    Pulse 77 12/11/2017 11:54 AM  Resp 12 12/11/2017 11:54 AM  SpO2 99 % 12/11/2017 11:54 AM  Vitals shown include unvalidated device data.  Last Pain:  Vitals:   12/11/17 1101  TempSrc: Oral  PainSc: 0-No pain      Patients Stated Pain Goal: 3 (03/54/65 6812)  Complications: No apparent anesthesia complications

## 2017-12-11 NOTE — Anesthesia Postprocedure Evaluation (Signed)
Anesthesia Post Note  Patient: Misty Woods  Procedure(s) Performed: RELEASE TRIGGER FINGER/A-1 PULLEY RIGHT MIDDLE FINGER (Right Hand)     Patient location during evaluation: PACU Anesthesia Type: MAC Level of consciousness: awake and alert Pain management: pain level controlled Vital Signs Assessment: post-procedure vital signs reviewed and stable Respiratory status: spontaneous breathing, nonlabored ventilation, respiratory function stable and patient connected to nasal cannula oxygen Cardiovascular status: stable and blood pressure returned to baseline Postop Assessment: no apparent nausea or vomiting Anesthetic complications: no    Last Vitals:  Vitals:   12/11/17 1215 12/11/17 1230  BP: (!) 150/88 (!) 148/78  Pulse: 72 70  Resp: 11 16  Temp:  36.7 C  SpO2: 94% 100%    Last Pain:  Vitals:   12/11/17 1230  TempSrc:   PainSc: 0-No pain                 Camira Geidel

## 2017-12-12 ENCOUNTER — Ambulatory Visit (INDEPENDENT_AMBULATORY_CARE_PROVIDER_SITE_OTHER): Payer: Medicare Other | Admitting: Internal Medicine

## 2017-12-12 ENCOUNTER — Ambulatory Visit (INDEPENDENT_AMBULATORY_CARE_PROVIDER_SITE_OTHER)
Admission: RE | Admit: 2017-12-12 | Discharge: 2017-12-12 | Disposition: A | Discharge status: Home / Self Care | Payer: Medicare Other | Source: Ambulatory Visit | Attending: Internal Medicine | Admitting: Internal Medicine

## 2017-12-12 ENCOUNTER — Encounter: Payer: Self-pay | Admitting: Internal Medicine

## 2017-12-12 DIAGNOSIS — R059 Cough, unspecified: Secondary | ICD-10-CM

## 2017-12-12 DIAGNOSIS — R05 Cough: Secondary | ICD-10-CM | POA: Diagnosis not present

## 2017-12-12 MED ORDER — PREDNISONE 10 MG PO TABS: ORAL_TABLET | ORAL | 0 refills | Status: DC

## 2017-12-12 MED ORDER — FAMOTIDINE 20 MG PO TABS: ORAL_TABLET | ORAL | 2 refills | Status: DC

## 2017-12-12 NOTE — Progress Notes (Signed)
Spoke with pt and notified of results per Dr. Wert. Pt verbalized understanding and denied any questions. 

## 2017-12-12 NOTE — Patient Instructions (Addendum)
Take delsym two tsp every 12 hours and supplement if needed with  vicodin   up to 1 every 4 hours to suppress the urge to cough. Swallowing water and/or using ice chips/non mint and menthol containing candies (such as lifesavers or sugarless jolly ranchers) are also effective.  You should rest your voice and avoid activities that you know make you cough.  Once you have eliminated the cough for 3 straight days try reducing the vicodin  first,  then the delsym as tolerated.     Prednisone 10 mg take  4 each am x 2 days,   2 each am x 2 days,  1 each am x 2 days and stop    Take CHLORPHENIRAMINE  4 mg - take one every 4 hours  - available over the counter- may cause drowsiness so take  Two x one hour before bedtime then up to 1 every 4 hours during the day     Please remember to go to the  x-ray department downstairs in the basement  for your tests - we will call you with the results when they are available.   Please schedule a follow up office visit in 4 weeks, sooner if needed  with all medications /inhalers/ solutions in hand so we can verify exactly what you are taking. This includes all medications from all doctors and over the counters

## 2017-12-12 NOTE — Progress Notes (Signed)
Misty Woods, female    DOB: 1945/07/25,    MRN: [id number]    Brief patient profile:  6  yowf trained as radiology tech grew up in [place] noted that cold air almost always made her cough as child/ adolescent  moved to [place]  With insidious onset of persistent daily cough since the early 1990's and progressively worse since 2009 with the only effective medication = gabapentin in long run but high doses caused dizziness and narcotics only worked short term/  referred   12/12/2017 by Dr   [name] as a second opinion   neg ent eval by [name], neg allergy eval by [name] and reported neg MCT 12/2015 with DgEsophagram 09/29/2017 Nonspecific esophageal motility disorder, small to moderate hiatal hernia with moderate spont GE reflux.  Mild narrowing at the GE junction but GI w/u "neg for gerd" as cause of the cough.  Historically has responded the best to gabapentin but gets cns side effects at effective levels.   Records reviewed:  Seen by me 01/09/10 reporting 100% resp to gerd/tramadol x 2 years but flared 10/2010 and rec repeat the cyclical cough rx with 6 d pred/  tramadol/gerd rx and return in 6 weeks if not 100% better but never returned      History of Present Illness  12/12/2017  Pulmonary/ 1st office eval/Misty Woods  "cough never got better"  Chief Complaint  Patient presents with  . Follow-up    Here for a 2nd opinion, states her cough has gotten worse over last 3  months, dry cough, states cold air and laughing triggers the cough and makes it worse.   Dyspnea:  Not limited by breathing from desired activities  / no cough with ex  Cough: see below/ never productive  Sleep: sometimes disrupted but mostly daytime SABA use: none     Kouffman Reflux v Neurogenic Cough Differentiator Reflux Comments  Do you awaken from a sound sleep coughing violently?                            With trouble breathing? occ     Do you have choking episodes when you cannot  Get enough air, gasping for air ?               Not often   Do you usually cough when you lie down into  The bed, or when you just lie down to rest ?                          Immediately 4/7 noct    Do you usually cough after meals or eating?         Water cold   Do you cough when (or after) you bend over?    no   GERD SCORE     Kouffman Reflux v Neurogenic Cough Differentiator Neurogenic   Do you more-or-less cough all day long? sporadic   Does change of temperature make you cough? yes   Does laughing or chuckling cause you to cough? yes   Do fumes (perfume, automobile fumes, burned  Toast, etc.,) cause you to cough ?      yes   Does speaking, singing, or talking on the phone cause you to cough   ?               Yes    Neurogenic/Airway score      No  other  obvious day to day or daytime variability or assoc excess/ purulent sputum or mucus plugs or hemoptysis or cp or chest tightness, subjective wheeze or overt sinus or hb symptoms.   Also denies any obvious fluctuation of symptoms with weather or environmental changes or other aggravating or alleviating factors except as outlined above   No unusual exposure hx or h/o childhood pna/ asthma or knowledge of premature birth.  Current Allergies, Complete Past Medical History, Past Surgical History, Family History, and Social History were reviewed in [employer] record.   ROS  The following are not active complaints unless bolded Hoarseness, sore throat, dysphagia, dental problems, itching, sneezing,  nasal congestion or discharge of excess mucus or purulent secretions, ear ache,   fever, chills, sweats, unintended wt loss or wt gain, classically pleuritic or exertional cp,  orthopnea pnd or arm/hand swelling  or leg swelling, presyncope, palpitations, abdominal pain, anorexia, nausea, vomiting, diarrhea  or change in bowel habits or change in bladder habits, change in stools or change in urine, dysuria, hematuria,  rash, arthralgias, visual complaints,  headache, numbness, weakness or ataxia or problems with walking or coordination,  change in mood or  memory.          Past Medical History:  Diagnosis Date  . Chronic cough    states "idiopathic"  . Dental crowns present    x 2  . High cholesterol   . Hypothyroidism   . Stenosing tenosynovitis of finger of right hand 12/2017   right third finger    Outpatient Medications Prior to Visit  Medication Sig Dispense Refill  . aspirin EC 81 MG tablet Take 81 mg by mouth daily.    . calcium citrate-vitamin D (CITRACAL+D) 315-200 MG-UNIT tablet Take by mouth.    . chlorpheniramine (CHLOR-TRIMETON) 4 MG tablet Take 8 mg by mouth 2 (two) times daily.    . Cholecalciferol (VITAMIN D3) 5000 units CAPS Take 1 capsule by mouth daily.    . cyanocobalamin (TH VITAMIN B12) 100 MCG tablet Take by mouth.    . gabapentin (NEURONTIN) 300 MG capsule Take 2 capsules (600 mg total) by mouth 2 (two) times daily. 360 capsule 3  . HYDROcodone-acetaminophen (NORCO) 5-325 MG tablet Take 1 tablet by mouth every 6 (six) hours as needed. 20 tablet 0  . levothyroxine (SYNTHROID, LEVOTHROID) 100 MCG tablet Take 100 mcg by mouth daily before breakfast.    . rosuvastatin (CRESTOR) 20 MG tablet Take 1 tablet (20 mg total) by mouth daily. 90 tablet 3      Objective:     BP 128/82   Pulse 79   Ht 5\' 1"  (1.549 m)   Wt 138 lb 12.8 oz (63 kg)   SpO2 99%   BMI 26.23 kg/m   SpO2: 99 % RA   amb wf with freq throat clearing   HEENT: nl dentition, turbinates bilaterally, and oropharynx which is pristine. Nl external ear canals without cough reflex   NECK :  without JVD/Nodes/TM/ nl carotid upstrokes bilaterally   LUNGS: no acc muscle use,  Nl contour chest which is clear to A and P bilaterally with  cough on insp  maneuvers   CV:  RRR  no s3 or murmur or increase in P2, and no edema   ABD:  soft and nontender with nl inspiratory excursion in the supine position. No bruits or organomegaly appreciated, bowel  sounds nl  MS:  Nl gait/ ext warm without deformities, calf tenderness, cyanosis or clubbing  No obvious joint restrictions   SKIN: warm and dry without lesions    NEURO:  alert, approp, nl sensorium with  no motor or cerebellar deficits apparent.     CXR PA and Lateral:   12/12/2017 :    I personally reviewed images  -  impression as follows:    no active dz      Assessment   Cough MCT  12/08/15  Borderline positive at highest dose DgEsophagram 09/29/2017 Nonspecific esophageal motility disorder, small to moderate hiatal hernia with moderate spont GE reflux   Hx  and cough questionaire most c/w UACS = Upper airway cough syndrome (previously labeled PNDS),  is so named because it's frequently impossible to sort out how much is  CR/sinusitis with freq throat clearing (which can be related to primary GERD)   vs  causing  secondary (" extra esophageal")  GERD from wide swings in gastric pressure that occur with throat clearing, often  promoting self use of mint and menthol lozenges that reduce the lower esophageal sphincter tone and exacerbate the problem further in a cyclical fashion.   These are the same pts (now being labeled as having "irritable larynx syndrome" by some cough centers) who not infrequently have a history of having failed to tolerate ace inhibitors,  dry powder inhalers or biphosphonates or report having atypical/extraesophageal reflux symptoms that don't respond to standard doses of PPI  and are easily confused as having aecopd or asthma flares by even experienced allergists/ pulmonologists (myself included).    Of the three most common causes of  Sub-acute / recurrent or chronic cough, only one (GERD)  can actually contribute to/ trigger  the other two (asthma and post nasal drip syndrome)  and perpetuate the cylce of cough.  While not intuitively obvious, many patients with chronic low grade reflux do not cough until there is a primary insult that disturbs the protective  epithelial barrier and exposes sensitive nerve endings.   This is typically viral but can due to PNDS and  either may apply here.     >>>> The point is that once this occurs, it is difficult to eliminate the cycle  using anything but a maximally effective acid suppression regimen at least in the short run, accompanied by an appropriate diet to address non acid GERD and control / eliminate the cough itself for at least 3 days with Vicodin and 1st gen H1 blockers per guidelines   - per the notes this worked before with sev years of "remission" in between per old records and should work again now.   Total time devoted to counseling  > 50 % of initial 60 min office visit:  review case with pt/husband  discussion of options/alternatives/ personally creating written customized instructions  in presence of pt  then going over those specific  Instructions directly with the pt including how to use all of the meds but in particular covering each new medication in detail and the difference between the maintenance= "automatic" meds and the prns using an action plan format for the latter (If this problem/symptom => do that organization reading Left to right).  Please see AVS from this visit for a full list of these instructions which I personally wrote for this pt and  are unique to this visit.              [name], MD 12/12/2017

## 2017-12-13 ENCOUNTER — Encounter: Payer: Self-pay | Admitting: Internal Medicine

## 2017-12-13 NOTE — Assessment & Plan Note (Addendum)
MCT  12/08/15  Borderline positive at highest dose DgEsophagram 09/29/2017 Nonspecific esophageal motility disorder, small to moderate hiatal hernia with moderate spont GE reflux   Hx  and cough questionaire most c/w UACS = Upper airway cough syndrome (previously labeled PNDS),  is so named because it's frequently impossible to sort out how much is  CR/sinusitis with freq throat clearing (which can be related to primary GERD)   vs  causing  secondary (" extra esophageal")  GERD from wide swings in gastric pressure that occur with throat clearing, often  promoting self use of mint and menthol lozenges that reduce the lower esophageal sphincter tone and exacerbate the problem further in a cyclical fashion.   These are the same pts (now being labeled as having "irritable larynx syndrome" by some cough centers) who not infrequently have a history of having failed to tolerate ace inhibitors,  dry powder inhalers or biphosphonates or report having atypical/extraesophageal reflux symptoms that don't respond to standard doses of PPI  and are easily confused as having aecopd or asthma flares by even experienced allergists/ pulmonologists (myself included).    Of the three most common causes of  Sub-acute / recurrent or chronic cough, only one (GERD)  can actually contribute to/ trigger  the other two (asthma and post nasal drip syndrome)  and perpetuate the cylce of cough.  While not intuitively obvious, many patients with chronic low grade reflux do not cough until there is a primary insult that disturbs the protective epithelial barrier and exposes sensitive nerve endings.   This is typically viral but can due to PNDS and  either may apply here.     >>>> The point is that once this occurs, it is difficult to eliminate the cycle  using anything but a maximally effective acid suppression regimen at least in the short run, accompanied by an appropriate diet to address non acid GERD and control / eliminate the cough  itself for at least 3 days with Vicodin and 1st gen H1 blockers per guidelines   - per the notes this worked before with sev years of "remission" in between per old records and should work again now.   Total time devoted to counseling  > 50 % of initial 60 min office visit:  review case with pt/husband  discussion of options/alternatives/ personally creating written customized instructions  in presence of pt  then going over those specific  Instructions directly with the pt including how to use all of the meds but in particular covering each new medication in detail and the difference between the maintenance= "automatic" meds and the prns using an action plan format for the latter (If this problem/symptom => do that organization reading Left to right).  Please see AVS from this visit for a full list of these instructions which I personally wrote for this pt and  are unique to this visit.

## 2017-12-15 NOTE — Telephone Encounter (Signed)
Hello Misty Woods,   We have received your message in regards to your medications. I will send a message over to Dr. Melvyn Novas for you to clarify the dosing times for you. As soon as we get a response, we will let you know.     ----- Message -----  From: Misty Woods  Sent: 11/10/20197:02 PM EST  To: [name], MD Subject: Non-Urgent Medical Question  Should I continue the gabapentin 600 in AM and 600 in PM? Also, only take the Oxy when I have a severe coughing episode?   Received the message above from patient. I reviewed her office note for clarification in regards to the timing of the gabapentin and Norco medications. The gabapentin RX just states to take this medication twice daily while the Norco states for her to take it every 6 hours as needed.   MW, please advise on the medication timing. Thanks!

## 2017-12-19 DIAGNOSIS — Z23 Encounter for immunization: Secondary | ICD-10-CM | POA: Diagnosis not present

## 2017-12-19 DIAGNOSIS — L821 Other seborrheic keratosis: Secondary | ICD-10-CM | POA: Diagnosis not present

## 2017-12-19 DIAGNOSIS — Z86018 Personal history of other benign neoplasm: Secondary | ICD-10-CM | POA: Diagnosis not present

## 2017-12-19 DIAGNOSIS — D239 Other benign neoplasm of skin, unspecified: Secondary | ICD-10-CM | POA: Diagnosis not present

## 2018-01-09 ENCOUNTER — Ambulatory Visit (INDEPENDENT_AMBULATORY_CARE_PROVIDER_SITE_OTHER): Payer: Medicare Other | Admitting: Internal Medicine

## 2018-01-09 ENCOUNTER — Encounter: Payer: Self-pay | Admitting: Internal Medicine

## 2018-01-09 VITALS — BP 124/80 | HR 75 | Ht 61.0 in | Wt 138.6 lb

## 2018-01-09 DIAGNOSIS — R05 Cough: Secondary | ICD-10-CM

## 2018-01-09 DIAGNOSIS — R059 Cough, unspecified: Secondary | ICD-10-CM

## 2018-01-09 DIAGNOSIS — I2510 Atherosclerotic heart disease of native coronary artery without angina pectoris: Secondary | ICD-10-CM

## 2018-01-09 MED ORDER — PREDNISONE 10 MG PO TABS: ORAL_TABLET | ORAL | 0 refills | Status: DC

## 2018-01-09 NOTE — Progress Notes (Signed)
Misty Woods, female    DOB: Apr 21, 1945,    MRN: [id number]    Brief patient profile:  45  yowf quit smoking 1970  trained as radiology tech grew up in [place] noted that cold air almost always made her cough as child/ adolescent  moved to [place]  With insidious onset of persistent daily cough since the early 1990's and progressively worse since 2009 with the only effective medication = gabapentin in long run but high doses caused dizziness and narcotics only worked short term/  referred   12/12/2017 by Dr   Misty Woods as a second opinion   neg ent eval by Misty Woods, neg allergy eval by Misty Woods and reported neg MCT 12/2015 with DgEsophagram 09/29/2017 Nonspecific esophageal motility disorder, small to moderate hiatal hernia with moderate spont GE reflux.  Mild narrowing at the GE junction but GI w/u "neg for gerd" as cause of the cough.  Historically has responded the best to gabapentin but gets cns side effects at effective levels.   Records reviewed:  Seen by me 01/09/10 reporting 100% resp to gerd/tramadol x 2 years but flared 10/2010 and rec repeat the cyclical cough rx with 6 d pred/  tramadol/gerd rx and return in 6 weeks if not 100% better but never returned      History of Present Illness  12/12/2017  Pulmonary/ 1st office eval/Misty Woods  "cough never got better"  Chief Complaint  Patient presents with  . Follow-up    Here for a 2nd opinion, states her cough has gotten worse over last 3  months, dry cough, states cold air and laughing triggers the cough and makes it worse.   Dyspnea:  Not limited by breathing from desired activities  / no cough with ex  Cough: see below/ never productive  Sleep: sometimes disrupted but mostly daytime SABA use: none  Kouffman Reflux v Neurogenic Cough Differentiator Reflux Comments  Do you awaken from a sound sleep coughing violently?                            With trouble breathing? occ     Do you have choking episodes when you cannot  Get enough air, gasping  for air ?              Not often   Do you usually cough when you lie down into  The bed, or when you just lie down to rest ?                          Immediately 4/7 noct    Do you usually cough after meals or eating?         Water cold   Do you cough when (or after) you bend over?    no   GERD SCORE     Kouffman Reflux v Neurogenic Cough Differentiator Neurogenic   Do you more-or-less cough all day long? sporadic   Does change of temperature make you cough? yes   Does laughing or chuckling cause you to cough? yes   Do fumes (perfume, automobile fumes, burned  Toast, etc.,) cause you to cough ?      yes   Does speaking, singing, or talking on the phone cause you to cough   ?               Yes    Neurogenic/Airway score  rec Take delsym two tsp every 12 hours and supplement if needed with  vicodin   up to 1 every 4 hours to suppress the urge to cough. Swallowing water and/or using ice chips/non mint and menthol containing candies (such as lifesavers or sugarless jolly ranchers) are also effective.  You should rest your voice and avoid activities that you know make you cough. Once you have eliminated the cough for 3 straight days try reducing the vicodin  first,  then the delsym as tolerated.   Prednisone 10 mg take  4 each am x 2 days,   2 each am x 2 days,  1 each am x 2 days and stop Take CHLORPHENIRAMINE  4 mg - take one every 4 hours  -   Please schedule a follow up office visit in 4 weeks,      01/09/2018  f/u ov/Misty Woods re: cough x 2005  Chief Complaint  Patient presents with  . Follow-up    Cough is unchanged. No new co's.  Dyspnea:  Walks fast most days s sob all conditions s provoking cough or sob   Cough: 4/7 noct coughs dry / no change from prior ov though husband disagrees/ delsym not helping   Sleeping: not typically waking up all hours  SABA use: none  02: none  Has sensation of constant throat tickle (points to suprasternal notch)    No obvious day to day or  daytime variability or assoc excess/ purulent sputum or mucus plugs or hemoptysis or cp or chest tightness, subjective wheeze or overt sinus or hb symptoms.    Also denies any obvious fluctuation of symptoms with weather or environmental changes or other aggravating or alleviating factors except as outlined above   No unusual exposure hx or h/o childhood pna/ asthma or knowledge of premature birth.  Current Allergies, Complete Past Medical History, Past Surgical History, Family History, and Social History were reviewed in [employer] record.  ROS  The following are not active complaints unless bolded Hoarseness, sore throat, dysphagia, dental problems, itching, sneezing,  nasal congestion or discharge of excess mucus or purulent secretions, ear ache,   fever, chills, sweats, unintended wt loss or wt gain, classically pleuritic or exertional cp,  orthopnea pnd or arm/hand swelling  or leg swelling, presyncope, palpitations, abdominal pain, anorexia, nausea, vomiting, diarrhea  or change in bowel habits or change in bladder habits, change in stools or change in urine, dysuria, hematuria,  rash, arthralgias, visual complaints, headache, numbness, weakness or ataxia or problems with walking or coordination,  change in mood or  memory.        Current Meds  Medication Sig  . aspirin EC 81 MG tablet Take 81 mg by mouth daily.  . calcium citrate-vitamin D (CITRACAL+D) 315-200 MG-UNIT tablet Take by mouth.  . chlorpheniramine (CHLOR-TRIMETON) 4 MG tablet Take 8 mg by mouth 2 (two) times daily.  . Cholecalciferol (VITAMIN D3) 5000 units CAPS Take 1 capsule by mouth daily.  . cyanocobalamin (TH VITAMIN B12) 100 MCG tablet Take by mouth.  . dextromethorphan (DELSYM) 30 MG/5ML liquid Take 15 mg by mouth 2 (two) times daily as needed for cough.  . famotidine (PEPCID) 20 MG tablet One after bfast and supper  . gabapentin (NEURONTIN) 300 MG capsule Take 2 capsules (600 mg total) by mouth  2 (two) times daily.  Marland Kitchen levothyroxine (SYNTHROID, LEVOTHROID) 100 MCG tablet Take 100 mcg by mouth daily before breakfast.  . rosuvastatin (CRESTOR) 20 MG tablet Take 1  tablet (20 mg total) by mouth daily.                  Objective:       Wt Readings from Last 3 Encounters:  01/09/18 138 lb 9.6 oz (62.9 kg)  12/12/17 138 lb 12.8 oz (63 kg)  12/11/17 136 lb 11 oz (62 kg)     Vital signs reviewed - Note on arrival 02 sats  100% on RA      HEENT: nl dentition, turbinates bilaterally, and oropharynx which is pristine . Nl external ear canals without cough reflex   NECK :  without JVD/Nodes/TM/ nl carotid upstrokes bilaterally   LUNGS: no acc muscle use,  Nl contour chest which is clear to A and P bilaterally without cough now  on insp maneuvers   CV:  RRR  no s3 or murmur or increase in P2, and no edema   ABD:  soft and nontender with nl inspiratory excursion in the supine position. No bruits or organomegaly appreciated, bowel sounds nl  MS:  Nl gait/ ext warm without deformities, calf tenderness, cyanosis or clubbing No obvious joint restrictions   SKIN: warm and dry without lesions    NEURO:  alert, approp, nl sensorium with  no motor or cerebellar deficits apparent.         Assessment

## 2018-01-09 NOTE — Patient Instructions (Addendum)
GERD (REFLUX)  is an extremely common cause of respiratory symptoms just like yours , many times with no obvious heartburn at all.    It can be treated with medication, but also with lifestyle changes including elevation of the head of your bed (ideally with 6 -8 in inch  bed blocks),  Smoking cessation, avoidance of late meals, excessive alcohol, and avoid fatty foods, chocolate, peppermint, colas, red wine, and acidic juices such as orange juice.  NO MINT OR MENTHOL PRODUCTS SO NO COUGH DROPS   USE SUGARLESS CANDY INSTEAD (Jolley ranchers or Stover's or Life Savers) or even ice chips will also do - the key is to swallow to prevent all throat clearing. NO OIL BASED VITAMINS - use powdered substitutes.  Try chlorpheniramine up to 2 after supper and before bed.  If not happy next add prednisone 10 mg Take 4 for three days 3 for three days 2 for three days 1 for three days and stop  Keep the candy handy as above    If still not happy then call for referral to [facility] [name] for upper airway cough syndrome

## 2018-01-11 ENCOUNTER — Encounter: Payer: Self-pay | Admitting: Internal Medicine

## 2018-01-11 NOTE — Assessment & Plan Note (Signed)
MCT  12/08/15  Borderline positive at highest dose DgEsophagram 09/29/2017 Nonspecific esophageal motility disorder, small to moderate hiatal hernia with moderate spont GE reflux   Clearly no longer coughing on insp maneuvers but still coughing day > noct with  sensation of globus noted and ? Somewhat responsive to pred per husband so rec doubling prednisone and dose of 1st gen H1 blockers per guidelines  Esp in pm p supper when cough tends to be worse plus continue max h2s and if not convinced then next step is refer to [facility] voice center Dr Joya Gaskins as already on gabapentin.    I had an extended discussion with the patient and husband  reviewing all relevant studies completed to date and  lasting 15 to 20 minutes of a 25 minute visit    Each maintenance medication was reviewed in detail including most importantly the difference between maintenance and prns and under what circumstances the prns are to be triggered using an action plan format that is not reflected in the computer generated alphabetically organized AVS.     Please see AVS for specific instructions unique to this visit that I personally wrote and verbalized to the the pt in detail and then reviewed with pt  by my nurse highlighting any  changes in therapy recommended at today's visit to their plan of care.

## 2018-01-14 ENCOUNTER — Other Ambulatory Visit: Payer: Self-pay | Admitting: Internal Medicine

## 2018-01-14 DIAGNOSIS — Z0000 Encounter for general adult medical examination without abnormal findings: Secondary | ICD-10-CM | POA: Diagnosis not present

## 2018-01-14 DIAGNOSIS — Z1389 Encounter for screening for other disorder: Secondary | ICD-10-CM | POA: Diagnosis not present

## 2018-01-14 DIAGNOSIS — E7800 Pure hypercholesterolemia, unspecified: Secondary | ICD-10-CM | POA: Diagnosis not present

## 2018-01-14 DIAGNOSIS — E890 Postprocedural hypothyroidism: Secondary | ICD-10-CM | POA: Diagnosis not present

## 2018-01-14 DIAGNOSIS — R05 Cough: Secondary | ICD-10-CM | POA: Diagnosis not present

## 2018-01-14 DIAGNOSIS — M8580 Other specified disorders of bone density and structure, unspecified site: Secondary | ICD-10-CM | POA: Diagnosis not present

## 2018-01-31 DIAGNOSIS — R238 Other skin changes: Secondary | ICD-10-CM | POA: Diagnosis not present

## 2018-02-04 DIAGNOSIS — C439 Malignant melanoma of skin, unspecified: Secondary | ICD-10-CM

## 2018-02-04 HISTORY — DX: Malignant melanoma of skin, unspecified: C43.9

## 2018-02-05 NOTE — Telephone Encounter (Signed)
If not better on prednisone prior to head cold then definitely need referral to Dr Joya Gaskins - if she was quite a bit better from prednisone and then worse just with onset of  the cold then that suggest two different problems and that always makes it difficult to sort out in terms of cause and effect.   If Dr Joya Gaskins can't see her right away, return with all meds here for short term control

## 2018-02-05 NOTE — Telephone Encounter (Signed)
MW please advise. Per your last AVS place referral to Dr. Addison Bailey at [facility] for UAC. Patient is requesting a referral to Dr. Joya Gaskins at [facility].

## 2018-02-06 ENCOUNTER — Encounter: Payer: Self-pay | Admitting: Internal Medicine

## 2018-02-06 ENCOUNTER — Ambulatory Visit (INDEPENDENT_AMBULATORY_CARE_PROVIDER_SITE_OTHER): Payer: Medicare Other | Admitting: Internal Medicine

## 2018-02-06 VITALS — BP 122/80 | HR 72 | Temp 98.1°F

## 2018-02-06 DIAGNOSIS — R05 Cough: Secondary | ICD-10-CM

## 2018-02-06 DIAGNOSIS — R059 Cough, unspecified: Secondary | ICD-10-CM

## 2018-02-06 NOTE — Patient Instructions (Addendum)
Take delsym two tsp every 12 hours and supplement if needed with  vicodin   up to 1 every 4 hours to suppress the urge to cough. Swallowing water and/or using ice chips/non mint and menthol containing candies (such as lifesavers or sugarless jolly ranchers) are also effective.  You should rest your voice and avoid activities that you know make you cough.  Once you have eliminated the cough for 3 straight days try reducing the vicodin  first,  then the delsym as tolerated.   Please see patient coordinator before you leave today  to schedule Dr Joya Gaskins

## 2018-02-06 NOTE — Progress Notes (Signed)
Misty Woods, female    DOB: 1945-10-31,    MRN: [id number]    Brief patient profile:  63  yowf quit smoking 1970  trained as radiology tech grew up in [place] noted that cold air almost always made her cough as child/ adolescent  moved to [place]  With insidious onset of persistent daily cough since the early 1990's and progressively worse since 2009 with the only effective medication = gabapentin in long run but high doses caused dizziness and narcotics only worked short term/  referred   12/12/2017 by Dr   [name] as a second opinion   neg ent eval by [name], neg allergy eval by [name] and reported neg MCT 12/2015 with DgEsophagram 09/29/2017 Nonspecific esophageal motility disorder, small to moderate hiatal hernia with moderate spont GE reflux.  Mild narrowing at the GE junction but GI w/u "neg for gerd" as cause of the cough.  Historically has responded the best to gabapentin but gets cns side effects at effective levels.   Records reviewed:  Seen by me 01/09/10 reporting 100% resp to gerd/tramadol x 2 years but flared 10/2010 and rec repeat the cyclical cough rx with 6 d pred/  tramadol/gerd rx and return in 6 weeks if not 100% better but never returned      History of Present Illness  12/12/2017  Pulmonary/ 1st office eval/[name]  "cough never got better"  Chief Complaint  Patient presents with  . Follow-up    Here for a 2nd opinion, states her cough has gotten worse over last 3  months, dry cough, states cold air and laughing triggers the cough and makes it worse.   Dyspnea:  Not limited by breathing from desired activities  / no cough with ex  Cough: see below/ never productive  Sleep: sometimes disrupted but mostly daytime SABA use: none  Kouffman Reflux v Neurogenic Cough Differentiator Reflux Comments  Do you awaken from a sound sleep coughing violently?                            With trouble breathing? occ     Do you have choking episodes when you cannot  Get enough air, gasping  for air ?              Not often   Do you usually cough when you lie down into  The bed, or when you just lie down to rest ?                          Immediately 4/7 noct    Do you usually cough after meals or eating?         Water cold   Do you cough when (or after) you bend over?    no   GERD SCORE     Kouffman Reflux v Neurogenic Cough Differentiator Neurogenic   Do you more-or-less cough all day long? sporadic   Does change of temperature make you cough? yes   Does laughing or chuckling cause you to cough? yes   Do fumes (perfume, automobile fumes, burned  Toast, etc.,) cause you to cough ?      yes   Does speaking, singing, or talking on the phone cause you to cough   ?               Yes    Neurogenic/Airway score  rec Take delsym two tsp every 12 hours and supplement if needed with  vicodin up to 1 every 4 hours to suppress the urge to cough. Swallowing water and/or using ice chips/non mint and menthol containing candies (such as lifesavers or sugarless jolly ranchers) are also effective.  You should rest your voice and avoid activities that you know make you cough. Once you have eliminated the cough for 3 straight days try reducing the vicodin  first,  then the delsym as tolerated.   Prednisone 10 mg take  4 each am x 2 days,   2 each am x 2 days,  1 each am x 2 days and stop Take CHLORPHENIRAMINE  4 mg - take one every 4 hours  -   Please schedule a follow up office visit in 4 weeks    01/09/2018  f/u ov/[name] re: cough x 2005  Chief Complaint  Patient presents with  . Follow-up    Cough is unchanged. No new co's.  Dyspnea:  Walks fast most days s sob all conditions s provoking cough or sob   Cough:  4/7 noct coughs dry / no change from prior ov though husband disagrees/ delsym not helping   Sleeping: not typically waking up all hours  SABA use: none  02: none  Has sensation of constant throat tickle (points to suprasternal notch)  rec GERD  Diet  Try chlorpheniramine  (chlortabs)  up to 2 after supper and before bed. If not happy next add prednisone 10 mg Take 4 for three days 3 for three days 2 for three days 1 for three days and stop Keep the candy handy as above   If still not happy then call for referral to [name] [name] for upper airway cough syndrome      02/06/2018  Acute extended ov/[name] re: cough x 2005 worse since last ov / did not bring meds as req but bag full of cough drops instead Chief Complaint  Patient presents with  . Acute Visit    Pt c/o cough x 2 wks- non prod. She has had some nose bleeds- seen by [name] walkin and currently on cephalexin.   Dyspnea:  Denies -Not limited by breathing from desired activities   Cough no better at all on above then at end of prednisone felt she was coming down with cold = fever / cough /lots of nasal d/c some bloody then seen by [name] clinic 01/24/18  rx keflex >  Fever gone, nasal discharge is clear / cough is worse Chlorpheniramine does not affect the tickle at suprasternal notch   Still using hall's cough drops against my advice / does not have bed blocks     No obvious day to day or daytime variability or assoc excess/ purulent sputum or mucus plugs or hemoptysis or cp or chest tightness, subjective wheeze or overt   hb symptoms.    Also denies any obvious fluctuation of symptoms with weather or environmental changes or other aggravating or alleviating factors except as outlined above   No unusual exposure hx or h/o childhood pna/ asthma or knowledge of premature birth.  Current Allergies, Complete Past Medical History, Past Surgical History, Family History, and Social History were reviewed in [employer] record.  ROS  The following are not active complaints unless bolded Hoarseness, sore throat, dysphagia, dental problems, itching, sneezing,  nasal congestion or discharge of excess mucus or purulent secretions, ear ache,   fever, chills, sweats, unintended wt loss  or wt gain, classically pleuritic or exertional cp,  orthopnea pnd or arm/hand swelling  or leg swelling, presyncope, palpitations, abdominal pain, anorexia, nausea, vomiting, diarrhea  or change in bowel habits or change in bladder habits, change in stools or change in urine, dysuria, hematuria,  rash, arthralgias, visual complaints, headache, numbness, weakness or ataxia or problems with walking or coordination,  change in mood or  memory.        Current Meds  Medication Sig  . aspirin EC 81 MG tablet Take 81 mg by mouth daily.  . calcium citrate-vitamin D (CITRACAL+D) 315-200 MG-UNIT tablet Take by mouth.  . cephALEXin (KEFLEX) 500 MG capsule Take 500 mg by mouth 3 (three) times daily.  . chlorpheniramine (CHLOR-TRIMETON) 4 MG tablet Take 8 mg by mouth 2 (two) times daily.  . Cholecalciferol (VITAMIN D3) 5000 units CAPS Take 1 capsule by mouth daily.  . cyanocobalamin (TH VITAMIN B12) 100 MCG tablet Take by mouth.  . dextromethorphan (DELSYM) 30 MG/5ML liquid Take 15 mg by mouth 2 (two) times daily as needed for cough.  . famotidine (PEPCID) 20 MG tablet One after bfast and supper  . gabapentin (NEURONTIN) 300 MG capsule Take 2 capsules (600 mg total) by mouth 2 (two) times daily.  Marland Kitchen levothyroxine (SYNTHROID, LEVOTHROID) 100 MCG tablet Take 100 mcg by mouth daily before breakfast.  . rosuvastatin (CRESTOR) 20 MG tablet Take 1 tablet (20 mg total) by mouth daily.                   Objective:     02/06/2018          Declined wt  01/09/18 138 lb 9.6 oz (62.9 kg)  12/12/17 138 lb 12.8 oz (63 kg)  12/11/17 136 lb 11 oz (62 kg)      Vital signs reviewed - Note on arrival 02 sats  98% on Ra   HEENT: nl dentition, turbinates bilaterally, and oropharynx. Nl external ear canals without cough reflex   NECK :  without JVD/Nodes/TM/ nl carotid upstrokes bilaterally   LUNGS: no acc muscle use,  Nl contour chest which is clear to A and P bilaterally without cough on insp or exp  maneuvers   CV:  RRR  no s3 or murmur or increase in P2, and no edema   ABD:  soft and nontender with nl inspiratory excursion in the supine position. No bruits or organomegaly appreciated, bowel sounds nl  MS:  Nl gait/ ext warm without deformities, calf tenderness, cyanosis or clubbing No obvious joint restrictions   SKIN: warm and dry without lesions    NEURO:  alert, approp, nl sensorium with  no motor or cerebellar deficits apparent.                Assessment

## 2018-02-07 ENCOUNTER — Encounter: Payer: Self-pay | Admitting: Internal Medicine

## 2018-02-07 MED ORDER — HYDROCODONE-ACETAMINOPHEN 5-300 MG PO TABS: ORAL_TABLET | ORAL | 0 refills | Status: DC

## 2018-02-07 NOTE — Assessment & Plan Note (Signed)
MCT  12/08/15  Borderline positive at highest dose DgEsophagram 09/29/2017 Nonspecific esophageal motility disorder, small to moderate hiatal hernia with moderate spont GE reflux - 02/06/2018 neg response to prednisone/ 1st gen H1 blockers per guidelines  > referred to Bettina Gavia, [place] [facility]    Of the three most common causes of  Sub-acute / recurrent or chronic cough, only one (GERD)  can actually contribute to/ trigger  the other two (asthma and post nasal drip syndrome)  and perpetuate the cylce of cough.  While not intuitively obvious, many patients with chronic low grade reflux do not cough until there is a primary insult that disturbs the protective epithelial barrier and exposes sensitive nerve endings.   This is typically viral but can due to PNDS and  either may apply here.      >>> The point is that once this occurs, it is difficult to eliminate the cycle  using anything but a maximally effective acid suppression regimen at least in the short run, accompanied by an appropriate diet to address non acid GERD (including bed blocks, avoid cough drops) and control / eliminate the cough itself for at least 3 days with vicodin  - in the past this has worked effectively but still does not eliminate her sense of globus/ throat tickle so rec ent eval as above.   I had an extended discussion with the patient/husband reviewing all relevant studies completed to date and  lasting 15 to 20 minutes of a 25 minute visit    Each maintenance medication was reviewed in detail including most importantly the difference between maintenance and prns and under what circumstances the prns are to be triggered using an action plan format that is not reflected in the computer generated alphabetically organized AVS.     Please see AVS for specific instructions unique to this visit that I personally wrote and verbalized to the the pt in detail and then reviewed with pt  by my nurse highlighting any  changes in therapy  recommended at today's visit to their plan of care.

## 2018-02-16 DIAGNOSIS — R05 Cough: Secondary | ICD-10-CM | POA: Diagnosis not present

## 2018-02-16 DIAGNOSIS — Z87891 Personal history of nicotine dependence: Secondary | ICD-10-CM | POA: Diagnosis not present

## 2018-02-16 DIAGNOSIS — J384 Edema of larynx: Secondary | ICD-10-CM | POA: Diagnosis not present

## 2018-02-16 DIAGNOSIS — K219 Gastro-esophageal reflux disease without esophagitis: Secondary | ICD-10-CM | POA: Diagnosis not present

## 2018-02-17 ENCOUNTER — Ambulatory Visit
Admission: RE | Admit: 2018-02-17 | Discharge: 2018-02-17 | Disposition: A | Discharge status: Home / Self Care | Payer: Medicare Other | Source: Ambulatory Visit | Attending: Internal Medicine | Admitting: Internal Medicine

## 2018-02-17 DIAGNOSIS — Z780 Asymptomatic menopausal state: Secondary | ICD-10-CM | POA: Diagnosis not present

## 2018-02-17 DIAGNOSIS — J384 Edema of larynx: Secondary | ICD-10-CM | POA: Diagnosis not present

## 2018-02-17 DIAGNOSIS — R490 Dysphonia: Secondary | ICD-10-CM | POA: Diagnosis not present

## 2018-02-17 DIAGNOSIS — M85852 Other specified disorders of bone density and structure, left thigh: Secondary | ICD-10-CM | POA: Diagnosis not present

## 2018-02-17 DIAGNOSIS — E890 Postprocedural hypothyroidism: Secondary | ICD-10-CM | POA: Diagnosis not present

## 2018-02-17 DIAGNOSIS — M8580 Other specified disorders of bone density and structure, unspecified site: Secondary | ICD-10-CM

## 2018-02-17 DIAGNOSIS — K219 Gastro-esophageal reflux disease without esophagitis: Secondary | ICD-10-CM | POA: Diagnosis not present

## 2018-02-17 DIAGNOSIS — R05 Cough: Secondary | ICD-10-CM | POA: Diagnosis not present

## 2018-02-17 DIAGNOSIS — J385 Laryngeal spasm: Secondary | ICD-10-CM | POA: Diagnosis not present

## 2018-02-17 DIAGNOSIS — Z87891 Personal history of nicotine dependence: Secondary | ICD-10-CM | POA: Diagnosis not present

## 2018-03-04 ENCOUNTER — Other Ambulatory Visit: Payer: Self-pay | Admitting: Internal Medicine

## 2018-03-04 DIAGNOSIS — R059 Cough, unspecified: Secondary | ICD-10-CM

## 2018-03-04 DIAGNOSIS — R05 Cough: Secondary | ICD-10-CM

## 2018-03-16 ENCOUNTER — Ambulatory Visit (INDEPENDENT_AMBULATORY_CARE_PROVIDER_SITE_OTHER): Payer: Medicare Other | Admitting: Pulmonary Disease

## 2018-03-16 ENCOUNTER — Encounter: Payer: Self-pay | Admitting: Pulmonary Disease

## 2018-03-16 VITALS — BP 122/82 | HR 85 | Ht 61.0 in | Wt 141.0 lb

## 2018-03-16 DIAGNOSIS — R05 Cough: Secondary | ICD-10-CM | POA: Diagnosis not present

## 2018-03-16 DIAGNOSIS — R059 Cough, unspecified: Secondary | ICD-10-CM

## 2018-03-16 NOTE — Patient Instructions (Signed)
  Follow-up with the doctor at [facility] for treatment of cough I will make a follow-up appointment with me in 6 months time

## 2018-03-16 NOTE — Progress Notes (Signed)
Arvilla Salada    [id number]    [birth year]  Primary Care Physician:Griffin, Jenny Reichmann, MD  Referring Physician: Lavone Orn, MD [place] [employer] [place], [place] [address]  Chief complaint:  Follow up for chronic cough.  HPI: Mrs. Musgrave is a 73 year old with history of chronic cough for over 10 years. She was originally evaluated by Dr. Melvyn Novas in 2011 she was diagnosed with LPR, GERD and was placed on PPI twice a day and diet modification. This helped somewhat with her symptoms since 2017 she's noticed worsening cough. She was recently started on Flonase and Zantac was added to Protonix 40 mg twice a day. This has not changed his symptoms. We started her on chlorpheniramine, dymista, hycodan with no change in symptoms.   She has chronic daily hacking cough. This is worsened by cold air of water. She does not have any shortness of breath, wheezing. Seen by Dr. [name], ENT who did a laryngoscopy. As per the patient he did not find anything abnormal. She has stopped the dymista nasal spray since she feels it does not help. She continues on the Zyrtec. She was seen by GI by Dr. Rosalie Gums  and had a scope which showed multiple gastric polyps. She has a manometry study and followed up with GI at [facility].he was told that she does not have GERD and was taken off PPI.   She has been evaluated by Dr. [name] at the [place]. It is noted that her symptoms worsened around the same time she got up a pet parrot. Workup for hypersensitivity serologies are negative  Interim history: Continues to have ongoing cough. Treated by my partner Dr. Melvyn Novas who referred her to Dr. Carol Ada, ENT at [facility].  Diagnosed with neurogenic cough Started on amitriptyline in addition to gabapentin and referred to speech therapy.  Laryngoscopy also showed significant laryngeal edema due to LPR  Outpatient Encounter Medications as of 03/16/2018  Medication Sig  . aspirin EC 81 MG tablet Take  81 mg by mouth daily.  . calcium citrate-vitamin D (CITRACAL+D) 315-200 MG-UNIT tablet Take by mouth.  . cephALEXin (KEFLEX) 500 MG capsule Take 500 mg by mouth 3 (three) times daily.  . chlorpheniramine (CHLOR-TRIMETON) 4 MG tablet Take 8 mg by mouth 2 (two) times daily.  . Cholecalciferol (VITAMIN D3) 5000 units CAPS Take 1 capsule by mouth daily.  . cyanocobalamin (TH VITAMIN B12) 100 MCG tablet Take by mouth.  . dextromethorphan (DELSYM) 30 MG/5ML liquid Take 15 mg by mouth 2 (two) times daily as needed for cough.  . famotidine (PEPCID) 20 MG tablet TAKE 1 TABLET AFTER BREAKFAST AND SUPPER  . gabapentin (NEURONTIN) 300 MG capsule Take 2 capsules (600 mg total) by mouth 2 (two) times daily.  Marland Kitchen HYDROcodone-Acetaminophen (VICODIN) 5-300 MG TABS One up to every 4 hours for cough - stop after 3-5 days  . levothyroxine (SYNTHROID, LEVOTHROID) 100 MCG tablet Take 100 mcg by mouth daily before breakfast.  . rosuvastatin (CRESTOR) 20 MG tablet Take 1 tablet (20 mg total) by mouth daily.   No facility-administered encounter medications on file as of 03/16/2018.    Physical Exam: Blood pressure 122/82, pulse 85, height 5\' 1"  (1.549 m), weight 141 lb (64 kg), SpO2 98 %. Gen:      No acute distress HEENT:  EOMI, sclera anicteric Neck:     No masses; no thyromegaly Lungs:    Clear to auscultation bilaterally; normal respiratory effort CV:  Regular rate and rhythm; no murmurs Abd:      + bowel sounds; soft, non-tender; no palpable masses, no distension Ext:    No edema; adequate peripheral perfusion Skin:      Warm and dry; no rash Neuro: alert and oriented x 3 Psych: normal mood and affect  Data Reviewed: Imaging CXR 10/06/15- No acute abnormalities. Images reviewed. High res CT of chest 12/05/15-no evidence of interstitial lung disease. No pulmonary findings to explain patient's cough. Aortic atherosclerosis. Coronary artery calcifications.  CT cardiac 01/17/2017- renal artery  calcifications along the left anterior descending, left circumflex and right coronary artery.  Small pulmonary nodules at the lung bases with minimal change compared to 2017. I have reviewed the images personally  PFTs 12/11/15 FVC 2.72 (5%) FEV1 1.89 (97%) next and/F 69 Minimal obstructive lung disease. No broncho-dilator response. Methacholine challenge test negative.  FENO 10/25/15- 19 FENO 01/06/17- 14 FENO 12/04/2017-50  Bronchocoscopy 04/11/16-normal airway inspection  GI studies 07/29/16 esophageal manometry: normal LES resting pressure and IRP, 3 failed swallows, 3 normal swallows, 4 weak swallows  07/29/16 pH-Z study (performed off PPI therapy): elevated total and supine distal acid exposure (9/7% and 16.8, respectively), with normal supine acid exposure (0.1%). 16 total reflux episodes (normal). It appears there are multiple prolonged upright acid exposure events present. 40 cough episodes reported, with a symptom index of 40% (16/40) and SAP of 99.9%  Esophagram 09/29/2017 Nonspecific esophageal motility disorder, small to moderate hiatal hernia with GE reflux.  Mild narrowing at the GE junction.  No esophageal mass.  Assessment:  Refractory chronic cough. Upper airway cough from post nasal drip vs neurogenic cough Her cough has been refractory to multiple interventions including treatment for postnasal drip, GERD.  She was evaluated by ENT and no abnormalities were found. She had a GI evaluation with a mano that apparently showed reflux but she cannot tolerate maximal acid suppression as she has developed gastric polyps that was though to be secondary to PPI. The symptoms are not very typical of asthma and FENO is low. Bronchoscopy, CT scan of the chest is normal and PFTs do not show any response to methacholine challenge. So far we have eliminated all obvious lung etiologies of cough. She has not tolerated codeine or tramadol due to side effects in the past   Currently being  evaluated at the cough clinic at [facility] by Dr. Joya Gaskins ENT Continue follow-up with them with gabapentin, nortriptyline and was training   GERD, Gastric polyps Off PPI per her GI doctor at [facility]. She was told that she does not have acid reflux although her last ENT exam shows significant LPR  Plan/Recommend ations: Continue gabapentin, nortriptyline Follow-up at [place] [facility] 03/16/2018, 9:41 AM  CC: Lavone Orn, MD

## 2018-03-17 DIAGNOSIS — K219 Gastro-esophageal reflux disease without esophagitis: Secondary | ICD-10-CM | POA: Diagnosis not present

## 2018-03-17 DIAGNOSIS — R05 Cough: Secondary | ICD-10-CM | POA: Diagnosis not present

## 2018-03-17 DIAGNOSIS — R490 Dysphonia: Secondary | ICD-10-CM | POA: Diagnosis not present

## 2018-03-17 DIAGNOSIS — J385 Laryngeal spasm: Secondary | ICD-10-CM | POA: Diagnosis not present

## 2018-03-17 DIAGNOSIS — J387 Other diseases of larynx: Secondary | ICD-10-CM | POA: Diagnosis not present

## 2018-03-26 DIAGNOSIS — H524 Presbyopia: Secondary | ICD-10-CM | POA: Diagnosis not present

## 2018-03-26 DIAGNOSIS — H2513 Age-related nuclear cataract, bilateral: Secondary | ICD-10-CM | POA: Diagnosis not present

## 2018-03-26 DIAGNOSIS — H02832 Dermatochalasis of right lower eyelid: Secondary | ICD-10-CM | POA: Diagnosis not present

## 2018-03-26 DIAGNOSIS — H25013 Cortical age-related cataract, bilateral: Secondary | ICD-10-CM | POA: Diagnosis not present

## 2018-03-26 DIAGNOSIS — H02831 Dermatochalasis of right upper eyelid: Secondary | ICD-10-CM | POA: Diagnosis not present

## 2018-03-30 DIAGNOSIS — K219 Gastro-esophageal reflux disease without esophagitis: Secondary | ICD-10-CM | POA: Diagnosis not present

## 2018-03-30 DIAGNOSIS — J385 Laryngeal spasm: Secondary | ICD-10-CM | POA: Diagnosis not present

## 2018-03-30 DIAGNOSIS — R490 Dysphonia: Secondary | ICD-10-CM | POA: Diagnosis not present

## 2018-03-30 DIAGNOSIS — R05 Cough: Secondary | ICD-10-CM | POA: Diagnosis not present

## 2018-03-30 DIAGNOSIS — J387 Other diseases of larynx: Secondary | ICD-10-CM | POA: Diagnosis not present

## 2018-04-01 DIAGNOSIS — S66811A Strain of other specified muscles, fascia and tendons at wrist and hand level, right hand, initial encounter: Secondary | ICD-10-CM | POA: Diagnosis not present

## 2018-04-21 DIAGNOSIS — J387 Other diseases of larynx: Secondary | ICD-10-CM | POA: Diagnosis not present

## 2018-04-21 DIAGNOSIS — R05 Cough: Secondary | ICD-10-CM | POA: Diagnosis not present

## 2018-04-21 DIAGNOSIS — Z79899 Other long term (current) drug therapy: Secondary | ICD-10-CM | POA: Diagnosis not present

## 2018-04-21 DIAGNOSIS — Z87891 Personal history of nicotine dependence: Secondary | ICD-10-CM | POA: Diagnosis not present

## 2018-04-21 DIAGNOSIS — K219 Gastro-esophageal reflux disease without esophagitis: Secondary | ICD-10-CM | POA: Diagnosis not present

## 2018-04-21 DIAGNOSIS — J385 Laryngeal spasm: Secondary | ICD-10-CM | POA: Diagnosis not present

## 2018-04-21 DIAGNOSIS — R490 Dysphonia: Secondary | ICD-10-CM | POA: Diagnosis not present

## 2018-04-22 DIAGNOSIS — S66811A Strain of other specified muscles, fascia and tendons at wrist and hand level, right hand, initial encounter: Secondary | ICD-10-CM | POA: Diagnosis not present

## 2018-07-22 DIAGNOSIS — M65341 Trigger finger, right ring finger: Secondary | ICD-10-CM | POA: Diagnosis not present

## 2018-07-22 DIAGNOSIS — M65332 Trigger finger, left middle finger: Secondary | ICD-10-CM | POA: Diagnosis not present

## 2018-07-27 DIAGNOSIS — Z1231 Encounter for screening mammogram for malignant neoplasm of breast: Secondary | ICD-10-CM | POA: Diagnosis not present

## 2018-08-05 DIAGNOSIS — C434 Malignant melanoma of scalp and neck: Secondary | ICD-10-CM | POA: Diagnosis not present

## 2018-08-05 DIAGNOSIS — D485 Neoplasm of uncertain behavior of skin: Secondary | ICD-10-CM | POA: Diagnosis not present

## 2018-08-11 ENCOUNTER — Other Ambulatory Visit (HOSPITAL_COMMUNITY): Payer: Self-pay | Admitting: Dermatology

## 2018-08-11 DIAGNOSIS — C434 Malignant melanoma of scalp and neck: Secondary | ICD-10-CM | POA: Diagnosis not present

## 2018-08-11 DIAGNOSIS — L905 Scar conditions and fibrosis of skin: Secondary | ICD-10-CM | POA: Diagnosis not present

## 2018-08-11 DIAGNOSIS — C439 Malignant melanoma of skin, unspecified: Secondary | ICD-10-CM

## 2018-08-11 DIAGNOSIS — L720 Epidermal cyst: Secondary | ICD-10-CM | POA: Diagnosis not present

## 2018-08-11 DIAGNOSIS — L910 Hypertrophic scar: Secondary | ICD-10-CM | POA: Diagnosis not present

## 2018-08-11 DIAGNOSIS — D485 Neoplasm of uncertain behavior of skin: Secondary | ICD-10-CM | POA: Diagnosis not present

## 2018-08-11 DIAGNOSIS — D239 Other benign neoplasm of skin, unspecified: Secondary | ICD-10-CM | POA: Diagnosis not present

## 2018-08-11 DIAGNOSIS — D2272 Melanocytic nevi of left lower limb, including hip: Secondary | ICD-10-CM | POA: Diagnosis not present

## 2018-08-11 DIAGNOSIS — D225 Melanocytic nevi of trunk: Secondary | ICD-10-CM | POA: Diagnosis not present

## 2018-08-14 ENCOUNTER — Ambulatory Visit (HOSPITAL_COMMUNITY)
Admission: RE | Admit: 2018-08-14 | Discharge: 2018-08-14 | Disposition: A | Discharge status: Home / Self Care | Payer: Medicare Other | Source: Ambulatory Visit | Attending: Dermatology | Admitting: Dermatology

## 2018-08-14 ENCOUNTER — Other Ambulatory Visit: Payer: Self-pay

## 2018-08-14 DIAGNOSIS — R918 Other nonspecific abnormal finding of lung field: Secondary | ICD-10-CM | POA: Insufficient documentation

## 2018-08-14 DIAGNOSIS — C439 Malignant melanoma of skin, unspecified: Secondary | ICD-10-CM | POA: Diagnosis not present

## 2018-08-14 DIAGNOSIS — C438 Malignant melanoma of overlapping sites of skin: Secondary | ICD-10-CM | POA: Diagnosis not present

## 2018-08-14 LAB — GLUCOSE, CAPILLARY: Glucose-Capillary: 80 mg/dL (ref 70–99)

## 2018-08-14 MED ORDER — FLUDEOXYGLUCOSE F - 18 (FDG) INJECTION
7.4000 | Freq: Once | INTRAVENOUS | Status: AC | PRN
  Administered 2018-08-14: 7.4 via INTRAVENOUS

## 2018-08-17 DIAGNOSIS — C434 Malignant melanoma of scalp and neck: Secondary | ICD-10-CM | POA: Diagnosis not present

## 2018-08-18 DIAGNOSIS — H2513 Age-related nuclear cataract, bilateral: Secondary | ICD-10-CM | POA: Diagnosis not present

## 2018-08-18 DIAGNOSIS — H02831 Dermatochalasis of right upper eyelid: Secondary | ICD-10-CM | POA: Diagnosis not present

## 2018-08-18 DIAGNOSIS — H524 Presbyopia: Secondary | ICD-10-CM | POA: Diagnosis not present

## 2018-08-18 DIAGNOSIS — H02834 Dermatochalasis of left upper eyelid: Secondary | ICD-10-CM | POA: Diagnosis not present

## 2018-08-19 DIAGNOSIS — D485 Neoplasm of uncertain behavior of skin: Secondary | ICD-10-CM | POA: Diagnosis not present

## 2018-08-19 DIAGNOSIS — Z4802 Encounter for removal of sutures: Secondary | ICD-10-CM | POA: Diagnosis not present

## 2018-08-19 DIAGNOSIS — C434 Malignant melanoma of scalp and neck: Secondary | ICD-10-CM | POA: Diagnosis not present

## 2018-08-19 DIAGNOSIS — D2372 Other benign neoplasm of skin of left lower limb, including hip: Secondary | ICD-10-CM | POA: Diagnosis not present

## 2018-08-21 DIAGNOSIS — C434 Malignant melanoma of scalp and neck: Secondary | ICD-10-CM | POA: Diagnosis not present

## 2018-08-21 DIAGNOSIS — C799 Secondary malignant neoplasm of unspecified site: Secondary | ICD-10-CM | POA: Diagnosis not present

## 2018-08-24 DIAGNOSIS — M65341 Trigger finger, right ring finger: Secondary | ICD-10-CM | POA: Diagnosis not present

## 2018-08-24 DIAGNOSIS — Z1159 Encounter for screening for other viral diseases: Secondary | ICD-10-CM | POA: Diagnosis not present

## 2018-08-24 DIAGNOSIS — M65332 Trigger finger, left middle finger: Secondary | ICD-10-CM | POA: Diagnosis not present

## 2018-08-24 DIAGNOSIS — C799 Secondary malignant neoplasm of unspecified site: Secondary | ICD-10-CM | POA: Diagnosis not present

## 2018-08-24 DIAGNOSIS — Z01812 Encounter for preprocedural laboratory examination: Secondary | ICD-10-CM | POA: Diagnosis not present

## 2018-08-31 DIAGNOSIS — C799 Secondary malignant neoplasm of unspecified site: Secondary | ICD-10-CM | POA: Diagnosis not present

## 2018-08-31 DIAGNOSIS — C434 Malignant melanoma of scalp and neck: Secondary | ICD-10-CM | POA: Diagnosis not present

## 2018-09-11 DIAGNOSIS — Z01812 Encounter for preprocedural laboratory examination: Secondary | ICD-10-CM | POA: Diagnosis not present

## 2018-09-11 DIAGNOSIS — C799 Secondary malignant neoplasm of unspecified site: Secondary | ICD-10-CM | POA: Diagnosis not present

## 2018-09-11 DIAGNOSIS — C434 Malignant melanoma of scalp and neck: Secondary | ICD-10-CM | POA: Diagnosis not present

## 2018-09-11 DIAGNOSIS — Z1159 Encounter for screening for other viral diseases: Secondary | ICD-10-CM | POA: Diagnosis not present

## 2018-09-11 DIAGNOSIS — Z20828 Contact with and (suspected) exposure to other viral communicable diseases: Secondary | ICD-10-CM | POA: Diagnosis not present

## 2018-09-18 DIAGNOSIS — Z483 Aftercare following surgery for neoplasm: Secondary | ICD-10-CM | POA: Diagnosis not present

## 2018-09-18 DIAGNOSIS — C792 Secondary malignant neoplasm of skin: Secondary | ICD-10-CM | POA: Diagnosis not present

## 2018-09-18 DIAGNOSIS — D434 Neoplasm of uncertain behavior of spinal cord: Secondary | ICD-10-CM | POA: Diagnosis not present

## 2018-09-18 DIAGNOSIS — C434 Malignant melanoma of scalp and neck: Secondary | ICD-10-CM | POA: Diagnosis not present

## 2018-09-18 DIAGNOSIS — Z85820 Personal history of malignant melanoma of skin: Secondary | ICD-10-CM | POA: Diagnosis not present

## 2018-09-18 DIAGNOSIS — Z87891 Personal history of nicotine dependence: Secondary | ICD-10-CM | POA: Diagnosis not present

## 2018-09-25 DIAGNOSIS — Z09 Encounter for follow-up examination after completed treatment for conditions other than malignant neoplasm: Secondary | ICD-10-CM | POA: Diagnosis not present

## 2018-09-25 DIAGNOSIS — C434 Malignant melanoma of scalp and neck: Secondary | ICD-10-CM | POA: Diagnosis not present

## 2018-11-04 DIAGNOSIS — K317 Polyp of stomach and duodenum: Secondary | ICD-10-CM | POA: Diagnosis not present

## 2018-11-04 DIAGNOSIS — Z86010 Personal history of colonic polyps: Secondary | ICD-10-CM | POA: Diagnosis not present

## 2018-11-04 DIAGNOSIS — R05 Cough: Secondary | ICD-10-CM | POA: Diagnosis not present

## 2018-11-04 DIAGNOSIS — Z8371 Family history of colonic polyps: Secondary | ICD-10-CM | POA: Diagnosis not present

## 2018-11-16 DIAGNOSIS — M8580 Other specified disorders of bone density and structure, unspecified site: Secondary | ICD-10-CM | POA: Diagnosis not present

## 2018-11-16 DIAGNOSIS — E7800 Pure hypercholesterolemia, unspecified: Secondary | ICD-10-CM | POA: Diagnosis not present

## 2018-11-16 DIAGNOSIS — E890 Postprocedural hypothyroidism: Secondary | ICD-10-CM | POA: Diagnosis not present

## 2018-11-16 DIAGNOSIS — D509 Iron deficiency anemia, unspecified: Secondary | ICD-10-CM | POA: Diagnosis not present

## 2018-11-16 DIAGNOSIS — M169 Osteoarthritis of hip, unspecified: Secondary | ICD-10-CM | POA: Diagnosis not present

## 2018-11-17 DIAGNOSIS — Z23 Encounter for immunization: Secondary | ICD-10-CM | POA: Diagnosis not present

## 2018-11-17 DIAGNOSIS — D225 Melanocytic nevi of trunk: Secondary | ICD-10-CM | POA: Diagnosis not present

## 2018-11-17 DIAGNOSIS — D2271 Melanocytic nevi of right lower limb, including hip: Secondary | ICD-10-CM | POA: Diagnosis not present

## 2018-11-17 DIAGNOSIS — Z85820 Personal history of malignant melanoma of skin: Secondary | ICD-10-CM | POA: Diagnosis not present

## 2018-11-17 DIAGNOSIS — L821 Other seborrheic keratosis: Secondary | ICD-10-CM | POA: Diagnosis not present

## 2018-11-17 DIAGNOSIS — L720 Epidermal cyst: Secondary | ICD-10-CM | POA: Diagnosis not present

## 2018-11-17 DIAGNOSIS — Z86018 Personal history of other benign neoplasm: Secondary | ICD-10-CM | POA: Diagnosis not present

## 2018-11-17 DIAGNOSIS — D2272 Melanocytic nevi of left lower limb, including hip: Secondary | ICD-10-CM | POA: Diagnosis not present

## 2018-11-17 DIAGNOSIS — L905 Scar conditions and fibrosis of skin: Secondary | ICD-10-CM | POA: Diagnosis not present

## 2018-11-23 ENCOUNTER — Other Ambulatory Visit: Payer: Self-pay | Admitting: Pulmonary Disease

## 2018-12-11 DIAGNOSIS — Z85820 Personal history of malignant melanoma of skin: Secondary | ICD-10-CM | POA: Diagnosis not present

## 2018-12-11 DIAGNOSIS — C434 Malignant melanoma of scalp and neck: Secondary | ICD-10-CM | POA: Diagnosis not present

## 2018-12-15 DIAGNOSIS — L821 Other seborrheic keratosis: Secondary | ICD-10-CM | POA: Diagnosis not present

## 2018-12-15 DIAGNOSIS — L820 Inflamed seborrheic keratosis: Secondary | ICD-10-CM | POA: Diagnosis not present

## 2018-12-15 DIAGNOSIS — L905 Scar conditions and fibrosis of skin: Secondary | ICD-10-CM | POA: Diagnosis not present

## 2018-12-15 DIAGNOSIS — Z23 Encounter for immunization: Secondary | ICD-10-CM | POA: Diagnosis not present

## 2018-12-23 DIAGNOSIS — M65341 Trigger finger, right ring finger: Secondary | ICD-10-CM | POA: Diagnosis not present

## 2018-12-23 DIAGNOSIS — M65332 Trigger finger, left middle finger: Secondary | ICD-10-CM | POA: Diagnosis not present

## 2019-02-10 DIAGNOSIS — Z86010 Personal history of colonic polyps: Secondary | ICD-10-CM | POA: Diagnosis not present

## 2019-02-10 DIAGNOSIS — R030 Elevated blood-pressure reading, without diagnosis of hypertension: Secondary | ICD-10-CM | POA: Diagnosis not present

## 2019-02-10 DIAGNOSIS — E890 Postprocedural hypothyroidism: Secondary | ICD-10-CM | POA: Diagnosis not present

## 2019-02-10 DIAGNOSIS — D509 Iron deficiency anemia, unspecified: Secondary | ICD-10-CM | POA: Diagnosis not present

## 2019-02-10 DIAGNOSIS — I2510 Atherosclerotic heart disease of native coronary artery without angina pectoris: Secondary | ICD-10-CM | POA: Diagnosis not present

## 2019-02-10 DIAGNOSIS — E7800 Pure hypercholesterolemia, unspecified: Secondary | ICD-10-CM | POA: Diagnosis not present

## 2019-02-10 DIAGNOSIS — R05 Cough: Secondary | ICD-10-CM | POA: Diagnosis not present

## 2019-02-10 DIAGNOSIS — Z85820 Personal history of malignant melanoma of skin: Secondary | ICD-10-CM | POA: Diagnosis not present

## 2019-02-16 DIAGNOSIS — E890 Postprocedural hypothyroidism: Secondary | ICD-10-CM | POA: Diagnosis not present

## 2019-02-16 DIAGNOSIS — R05 Cough: Secondary | ICD-10-CM | POA: Diagnosis not present

## 2019-02-16 DIAGNOSIS — K219 Gastro-esophageal reflux disease without esophagitis: Secondary | ICD-10-CM | POA: Diagnosis not present

## 2019-02-16 DIAGNOSIS — Z87891 Personal history of nicotine dependence: Secondary | ICD-10-CM | POA: Diagnosis not present

## 2019-02-22 ENCOUNTER — Ambulatory Visit: Payer: Medicare Other

## 2019-02-24 DIAGNOSIS — Z86018 Personal history of other benign neoplasm: Secondary | ICD-10-CM | POA: Diagnosis not present

## 2019-02-24 DIAGNOSIS — D2271 Melanocytic nevi of right lower limb, including hip: Secondary | ICD-10-CM | POA: Diagnosis not present

## 2019-02-24 DIAGNOSIS — Z85828 Personal history of other malignant neoplasm of skin: Secondary | ICD-10-CM | POA: Diagnosis not present

## 2019-02-24 DIAGNOSIS — L578 Other skin changes due to chronic exposure to nonionizing radiation: Secondary | ICD-10-CM | POA: Diagnosis not present

## 2019-02-24 DIAGNOSIS — D2272 Melanocytic nevi of left lower limb, including hip: Secondary | ICD-10-CM | POA: Diagnosis not present

## 2019-02-24 DIAGNOSIS — D225 Melanocytic nevi of trunk: Secondary | ICD-10-CM | POA: Diagnosis not present

## 2019-02-24 DIAGNOSIS — L821 Other seborrheic keratosis: Secondary | ICD-10-CM | POA: Diagnosis not present

## 2019-02-24 DIAGNOSIS — Z85820 Personal history of malignant melanoma of skin: Secondary | ICD-10-CM | POA: Diagnosis not present

## 2019-02-26 ENCOUNTER — Other Ambulatory Visit: Payer: Self-pay

## 2019-02-26 ENCOUNTER — Ambulatory Visit: Payer: Medicare Other | Attending: Internal Medicine

## 2019-02-26 ENCOUNTER — Ambulatory Visit: Payer: Medicare Other

## 2019-02-26 DIAGNOSIS — Z23 Encounter for immunization: Secondary | ICD-10-CM

## 2019-02-26 NOTE — Progress Notes (Signed)
   Covid-19 Vaccination Clinic  Name:  Misty Woods    MRN: [id number] DOB: October 13, 1945  02/26/2019  [name] was observed post Covid-19 immunization for 15 minutes without incidence. She was provided with Vaccine Information Sheet and instruction to access the V-Safe system.   [name] was instructed to call 911 with any severe reactions post vaccine: Marland Kitchen Difficulty breathing  . Swelling of your face and throat  . A fast heartbeat  . A bad rash all over your body  . Dizziness and weakness    Immunizations Administered    Name Date Dose VIS Date Route   Pfizer COVID-19 Vaccine 02/26/2019  9:08 AM 0.3 mL 01/15/2019 Intramuscular   Manufacturer: [place]   Lot: [id number]   [place]: [id number]

## 2019-03-11 DIAGNOSIS — Z1159 Encounter for screening for other viral diseases: Secondary | ICD-10-CM | POA: Diagnosis not present

## 2019-03-12 DIAGNOSIS — Z09 Encounter for follow-up examination after completed treatment for conditions other than malignant neoplasm: Secondary | ICD-10-CM | POA: Diagnosis not present

## 2019-03-12 DIAGNOSIS — M629 Disorder of muscle, unspecified: Secondary | ICD-10-CM | POA: Diagnosis not present

## 2019-03-12 DIAGNOSIS — R590 Localized enlarged lymph nodes: Secondary | ICD-10-CM | POA: Diagnosis not present

## 2019-03-12 DIAGNOSIS — R918 Other nonspecific abnormal finding of lung field: Secondary | ICD-10-CM | POA: Diagnosis not present

## 2019-03-12 DIAGNOSIS — R933 Abnormal findings on diagnostic imaging of other parts of digestive tract: Secondary | ICD-10-CM | POA: Diagnosis not present

## 2019-03-12 DIAGNOSIS — C434 Malignant melanoma of scalp and neck: Secondary | ICD-10-CM | POA: Diagnosis not present

## 2019-03-12 DIAGNOSIS — Z85820 Personal history of malignant melanoma of skin: Secondary | ICD-10-CM | POA: Diagnosis not present

## 2019-03-15 DIAGNOSIS — K648 Other hemorrhoids: Secondary | ICD-10-CM | POA: Diagnosis not present

## 2019-03-15 DIAGNOSIS — K5730 Diverticulosis of large intestine without perforation or abscess without bleeding: Secondary | ICD-10-CM | POA: Diagnosis not present

## 2019-03-15 DIAGNOSIS — K449 Diaphragmatic hernia without obstruction or gangrene: Secondary | ICD-10-CM | POA: Diagnosis not present

## 2019-03-15 DIAGNOSIS — K2210 Ulcer of esophagus without bleeding: Secondary | ICD-10-CM | POA: Diagnosis not present

## 2019-03-15 DIAGNOSIS — K219 Gastro-esophageal reflux disease without esophagitis: Secondary | ICD-10-CM | POA: Diagnosis not present

## 2019-03-15 DIAGNOSIS — Z86010 Personal history of colonic polyps: Secondary | ICD-10-CM | POA: Diagnosis not present

## 2019-03-15 DIAGNOSIS — K209 Esophagitis, unspecified without bleeding: Secondary | ICD-10-CM | POA: Diagnosis not present

## 2019-03-15 DIAGNOSIS — D123 Benign neoplasm of transverse colon: Secondary | ICD-10-CM | POA: Diagnosis not present

## 2019-03-15 DIAGNOSIS — K317 Polyp of stomach and duodenum: Secondary | ICD-10-CM | POA: Diagnosis not present

## 2019-03-18 ENCOUNTER — Ambulatory Visit: Payer: Medicare Other | Attending: Internal Medicine

## 2019-03-18 DIAGNOSIS — K219 Gastro-esophageal reflux disease without esophagitis: Secondary | ICD-10-CM | POA: Diagnosis not present

## 2019-03-18 DIAGNOSIS — Z23 Encounter for immunization: Secondary | ICD-10-CM | POA: Insufficient documentation

## 2019-03-18 DIAGNOSIS — K317 Polyp of stomach and duodenum: Secondary | ICD-10-CM | POA: Diagnosis not present

## 2019-03-18 DIAGNOSIS — D123 Benign neoplasm of transverse colon: Secondary | ICD-10-CM | POA: Diagnosis not present

## 2019-03-18 NOTE — Progress Notes (Signed)
   Covid-19 Vaccination Clinic  Name:  Misty Woods    MRN: [id number] DOB: 05/25/1945  03/18/2019  [name] was observed post Covid-19 immunization for 15 minutes without incidence. She was provided with Vaccine Information Sheet and instruction to access the V-Safe system.   [name] was instructed to call 911 with any severe reactions post vaccine: Marland Kitchen Difficulty breathing  . Swelling of your face and throat  . A fast heartbeat  . A bad rash all over your body  . Dizziness and weakness    Immunizations Administered    Name Date Dose VIS Date Route   Pfizer COVID-19 Vaccine 03/18/2019  2:28 PM 0.3 mL 01/15/2019 Intramuscular   Manufacturer: [place]   Lot: [id number]   [place]: [id number]

## 2019-03-30 DIAGNOSIS — K219 Gastro-esophageal reflux disease without esophagitis: Secondary | ICD-10-CM | POA: Diagnosis not present

## 2019-03-30 DIAGNOSIS — R05 Cough: Secondary | ICD-10-CM | POA: Diagnosis not present

## 2019-03-30 DIAGNOSIS — R490 Dysphonia: Secondary | ICD-10-CM | POA: Diagnosis not present

## 2019-03-31 DIAGNOSIS — M160 Bilateral primary osteoarthritis of hip: Secondary | ICD-10-CM | POA: Diagnosis not present

## 2019-03-31 DIAGNOSIS — C799 Secondary malignant neoplasm of unspecified site: Secondary | ICD-10-CM | POA: Diagnosis not present

## 2019-03-31 DIAGNOSIS — C439 Malignant melanoma of skin, unspecified: Secondary | ICD-10-CM | POA: Diagnosis not present

## 2019-04-20 DIAGNOSIS — E039 Hypothyroidism, unspecified: Secondary | ICD-10-CM | POA: Diagnosis not present

## 2019-05-24 DIAGNOSIS — Z79899 Other long term (current) drug therapy: Secondary | ICD-10-CM | POA: Diagnosis not present

## 2019-05-24 DIAGNOSIS — R05 Cough: Secondary | ICD-10-CM | POA: Diagnosis not present

## 2019-05-24 DIAGNOSIS — R490 Dysphonia: Secondary | ICD-10-CM | POA: Diagnosis not present

## 2019-05-28 DIAGNOSIS — Z1159 Encounter for screening for other viral diseases: Secondary | ICD-10-CM | POA: Diagnosis not present

## 2019-06-02 DIAGNOSIS — H02834 Dermatochalasis of left upper eyelid: Secondary | ICD-10-CM | POA: Diagnosis not present

## 2019-06-02 DIAGNOSIS — H2513 Age-related nuclear cataract, bilateral: Secondary | ICD-10-CM | POA: Diagnosis not present

## 2019-06-02 DIAGNOSIS — K317 Polyp of stomach and duodenum: Secondary | ICD-10-CM | POA: Diagnosis not present

## 2019-06-02 DIAGNOSIS — H524 Presbyopia: Secondary | ICD-10-CM | POA: Diagnosis not present

## 2019-06-02 DIAGNOSIS — K219 Gastro-esophageal reflux disease without esophagitis: Secondary | ICD-10-CM | POA: Diagnosis not present

## 2019-06-02 DIAGNOSIS — K209 Esophagitis, unspecified without bleeding: Secondary | ICD-10-CM | POA: Diagnosis not present

## 2019-06-02 DIAGNOSIS — H02831 Dermatochalasis of right upper eyelid: Secondary | ICD-10-CM | POA: Diagnosis not present

## 2019-06-02 DIAGNOSIS — H18593 Other hereditary corneal dystrophies, bilateral: Secondary | ICD-10-CM | POA: Diagnosis not present

## 2019-06-02 DIAGNOSIS — K228 Other specified diseases of esophagus: Secondary | ICD-10-CM | POA: Diagnosis not present

## 2019-06-04 DIAGNOSIS — D2272 Melanocytic nevi of left lower limb, including hip: Secondary | ICD-10-CM | POA: Diagnosis not present

## 2019-06-04 DIAGNOSIS — Z86018 Personal history of other benign neoplasm: Secondary | ICD-10-CM | POA: Diagnosis not present

## 2019-06-04 DIAGNOSIS — D2271 Melanocytic nevi of right lower limb, including hip: Secondary | ICD-10-CM | POA: Diagnosis not present

## 2019-06-04 DIAGNOSIS — D485 Neoplasm of uncertain behavior of skin: Secondary | ICD-10-CM | POA: Diagnosis not present

## 2019-06-04 DIAGNOSIS — L578 Other skin changes due to chronic exposure to nonionizing radiation: Secondary | ICD-10-CM | POA: Diagnosis not present

## 2019-06-04 DIAGNOSIS — D225 Melanocytic nevi of trunk: Secondary | ICD-10-CM | POA: Diagnosis not present

## 2019-06-04 DIAGNOSIS — L821 Other seborrheic keratosis: Secondary | ICD-10-CM | POA: Diagnosis not present

## 2019-06-04 DIAGNOSIS — Z85820 Personal history of malignant melanoma of skin: Secondary | ICD-10-CM | POA: Diagnosis not present

## 2019-06-04 DIAGNOSIS — K219 Gastro-esophageal reflux disease without esophagitis: Secondary | ICD-10-CM | POA: Diagnosis not present

## 2019-06-04 DIAGNOSIS — Z85828 Personal history of other malignant neoplasm of skin: Secondary | ICD-10-CM | POA: Diagnosis not present

## 2019-06-11 DIAGNOSIS — Z85820 Personal history of malignant melanoma of skin: Secondary | ICD-10-CM | POA: Diagnosis not present

## 2019-06-11 DIAGNOSIS — C799 Secondary malignant neoplasm of unspecified site: Secondary | ICD-10-CM | POA: Diagnosis not present

## 2019-06-11 DIAGNOSIS — Z08 Encounter for follow-up examination after completed treatment for malignant neoplasm: Secondary | ICD-10-CM | POA: Diagnosis not present

## 2019-06-16 DIAGNOSIS — D485 Neoplasm of uncertain behavior of skin: Secondary | ICD-10-CM | POA: Diagnosis not present

## 2019-06-16 DIAGNOSIS — H61001 Unspecified perichondritis of right external ear: Secondary | ICD-10-CM | POA: Diagnosis not present

## 2019-06-22 DIAGNOSIS — R413 Other amnesia: Secondary | ICD-10-CM | POA: Diagnosis not present

## 2019-06-22 DIAGNOSIS — E039 Hypothyroidism, unspecified: Secondary | ICD-10-CM | POA: Diagnosis not present

## 2019-07-07 DIAGNOSIS — J387 Other diseases of larynx: Secondary | ICD-10-CM | POA: Diagnosis not present

## 2019-07-07 DIAGNOSIS — Z87891 Personal history of nicotine dependence: Secondary | ICD-10-CM | POA: Diagnosis not present

## 2019-07-07 DIAGNOSIS — R05 Cough: Secondary | ICD-10-CM | POA: Diagnosis not present

## 2019-08-18 DIAGNOSIS — J387 Other diseases of larynx: Secondary | ICD-10-CM | POA: Diagnosis not present

## 2019-08-18 DIAGNOSIS — R490 Dysphonia: Secondary | ICD-10-CM | POA: Diagnosis not present

## 2019-08-18 DIAGNOSIS — R05 Cough: Secondary | ICD-10-CM | POA: Diagnosis not present

## 2019-08-18 DIAGNOSIS — K219 Gastro-esophageal reflux disease without esophagitis: Secondary | ICD-10-CM | POA: Diagnosis not present

## 2019-08-18 DIAGNOSIS — Z87891 Personal history of nicotine dependence: Secondary | ICD-10-CM | POA: Diagnosis not present

## 2019-08-24 DIAGNOSIS — I6782 Cerebral ischemia: Secondary | ICD-10-CM | POA: Diagnosis not present

## 2019-08-24 DIAGNOSIS — C434 Malignant melanoma of scalp and neck: Secondary | ICD-10-CM | POA: Diagnosis not present

## 2019-08-24 DIAGNOSIS — R519 Headache, unspecified: Secondary | ICD-10-CM | POA: Diagnosis not present

## 2019-09-17 DIAGNOSIS — Z08 Encounter for follow-up examination after completed treatment for malignant neoplasm: Secondary | ICD-10-CM | POA: Diagnosis not present

## 2019-09-17 DIAGNOSIS — Z85820 Personal history of malignant melanoma of skin: Secondary | ICD-10-CM | POA: Diagnosis not present

## 2019-09-17 DIAGNOSIS — Z9889 Other specified postprocedural states: Secondary | ICD-10-CM | POA: Diagnosis not present

## 2019-09-17 DIAGNOSIS — C434 Malignant melanoma of scalp and neck: Secondary | ICD-10-CM | POA: Diagnosis not present

## 2019-09-24 DIAGNOSIS — D485 Neoplasm of uncertain behavior of skin: Secondary | ICD-10-CM | POA: Diagnosis not present

## 2019-09-24 DIAGNOSIS — C4441 Basal cell carcinoma of skin of scalp and neck: Secondary | ICD-10-CM | POA: Diagnosis not present

## 2019-09-24 DIAGNOSIS — Z85828 Personal history of other malignant neoplasm of skin: Secondary | ICD-10-CM | POA: Diagnosis not present

## 2019-09-24 DIAGNOSIS — Z85820 Personal history of malignant melanoma of skin: Secondary | ICD-10-CM | POA: Diagnosis not present

## 2019-09-24 DIAGNOSIS — L821 Other seborrheic keratosis: Secondary | ICD-10-CM | POA: Diagnosis not present

## 2019-09-24 DIAGNOSIS — D2271 Melanocytic nevi of right lower limb, including hip: Secondary | ICD-10-CM | POA: Diagnosis not present

## 2019-09-24 DIAGNOSIS — D225 Melanocytic nevi of trunk: Secondary | ICD-10-CM | POA: Diagnosis not present

## 2019-09-24 DIAGNOSIS — Z86018 Personal history of other benign neoplasm: Secondary | ICD-10-CM | POA: Diagnosis not present

## 2019-09-24 DIAGNOSIS — L578 Other skin changes due to chronic exposure to nonionizing radiation: Secondary | ICD-10-CM | POA: Diagnosis not present

## 2019-09-24 DIAGNOSIS — D2272 Melanocytic nevi of left lower limb, including hip: Secondary | ICD-10-CM | POA: Diagnosis not present

## 2019-10-07 DIAGNOSIS — R05 Cough: Secondary | ICD-10-CM | POA: Diagnosis not present

## 2019-10-07 DIAGNOSIS — T781XXA Other adverse food reactions, not elsewhere classified, initial encounter: Secondary | ICD-10-CM | POA: Diagnosis not present

## 2019-10-07 DIAGNOSIS — K219 Gastro-esophageal reflux disease without esophagitis: Secondary | ICD-10-CM | POA: Diagnosis not present

## 2019-10-20 DIAGNOSIS — E7800 Pure hypercholesterolemia, unspecified: Secondary | ICD-10-CM | POA: Diagnosis not present

## 2019-10-20 DIAGNOSIS — E890 Postprocedural hypothyroidism: Secondary | ICD-10-CM | POA: Diagnosis not present

## 2019-10-20 DIAGNOSIS — D509 Iron deficiency anemia, unspecified: Secondary | ICD-10-CM | POA: Diagnosis not present

## 2019-10-20 DIAGNOSIS — M169 Osteoarthritis of hip, unspecified: Secondary | ICD-10-CM | POA: Diagnosis not present

## 2019-10-20 DIAGNOSIS — I2510 Atherosclerotic heart disease of native coronary artery without angina pectoris: Secondary | ICD-10-CM | POA: Diagnosis not present

## 2019-10-20 DIAGNOSIS — E039 Hypothyroidism, unspecified: Secondary | ICD-10-CM | POA: Diagnosis not present

## 2019-10-20 DIAGNOSIS — M8580 Other specified disorders of bone density and structure, unspecified site: Secondary | ICD-10-CM | POA: Diagnosis not present

## 2019-10-25 DIAGNOSIS — R05 Cough: Secondary | ICD-10-CM | POA: Diagnosis not present

## 2019-11-04 DIAGNOSIS — R05 Cough: Secondary | ICD-10-CM | POA: Diagnosis not present

## 2019-11-04 DIAGNOSIS — T781XXA Other adverse food reactions, not elsewhere classified, initial encounter: Secondary | ICD-10-CM | POA: Diagnosis not present

## 2019-11-24 DIAGNOSIS — Z23 Encounter for immunization: Secondary | ICD-10-CM | POA: Diagnosis not present

## 2019-12-22 DIAGNOSIS — R053 Chronic cough: Secondary | ICD-10-CM | POA: Diagnosis not present

## 2019-12-22 DIAGNOSIS — Z9889 Other specified postprocedural states: Secondary | ICD-10-CM | POA: Diagnosis not present

## 2019-12-23 DIAGNOSIS — D509 Iron deficiency anemia, unspecified: Secondary | ICD-10-CM | POA: Diagnosis not present

## 2019-12-23 DIAGNOSIS — E7800 Pure hypercholesterolemia, unspecified: Secondary | ICD-10-CM | POA: Diagnosis not present

## 2019-12-23 DIAGNOSIS — E890 Postprocedural hypothyroidism: Secondary | ICD-10-CM | POA: Diagnosis not present

## 2019-12-23 DIAGNOSIS — E039 Hypothyroidism, unspecified: Secondary | ICD-10-CM | POA: Diagnosis not present

## 2019-12-23 DIAGNOSIS — I2510 Atherosclerotic heart disease of native coronary artery without angina pectoris: Secondary | ICD-10-CM | POA: Diagnosis not present

## 2019-12-23 DIAGNOSIS — M8580 Other specified disorders of bone density and structure, unspecified site: Secondary | ICD-10-CM | POA: Diagnosis not present

## 2019-12-23 DIAGNOSIS — M169 Osteoarthritis of hip, unspecified: Secondary | ICD-10-CM | POA: Diagnosis not present

## 2019-12-24 DIAGNOSIS — R918 Other nonspecific abnormal finding of lung field: Secondary | ICD-10-CM | POA: Diagnosis not present

## 2019-12-24 DIAGNOSIS — K7689 Other specified diseases of liver: Secondary | ICD-10-CM | POA: Diagnosis not present

## 2019-12-24 DIAGNOSIS — Z483 Aftercare following surgery for neoplasm: Secondary | ICD-10-CM | POA: Diagnosis not present

## 2019-12-24 DIAGNOSIS — C434 Malignant melanoma of scalp and neck: Secondary | ICD-10-CM | POA: Diagnosis not present

## 2019-12-24 DIAGNOSIS — J984 Other disorders of lung: Secondary | ICD-10-CM | POA: Diagnosis not present

## 2019-12-28 DIAGNOSIS — L723 Sebaceous cyst: Secondary | ICD-10-CM | POA: Diagnosis not present

## 2019-12-28 DIAGNOSIS — Z85828 Personal history of other malignant neoplasm of skin: Secondary | ICD-10-CM | POA: Diagnosis not present

## 2019-12-28 DIAGNOSIS — D225 Melanocytic nevi of trunk: Secondary | ICD-10-CM | POA: Diagnosis not present

## 2019-12-28 DIAGNOSIS — Z86018 Personal history of other benign neoplasm: Secondary | ICD-10-CM | POA: Diagnosis not present

## 2019-12-28 DIAGNOSIS — D2271 Melanocytic nevi of right lower limb, including hip: Secondary | ICD-10-CM | POA: Diagnosis not present

## 2019-12-28 DIAGNOSIS — D2372 Other benign neoplasm of skin of left lower limb, including hip: Secondary | ICD-10-CM | POA: Diagnosis not present

## 2019-12-28 DIAGNOSIS — D2230 Melanocytic nevi of unspecified part of face: Secondary | ICD-10-CM | POA: Diagnosis not present

## 2019-12-28 DIAGNOSIS — L821 Other seborrheic keratosis: Secondary | ICD-10-CM | POA: Diagnosis not present

## 2019-12-28 DIAGNOSIS — Z85820 Personal history of malignant melanoma of skin: Secondary | ICD-10-CM | POA: Diagnosis not present

## 2019-12-28 DIAGNOSIS — D2272 Melanocytic nevi of left lower limb, including hip: Secondary | ICD-10-CM | POA: Diagnosis not present

## 2019-12-28 DIAGNOSIS — L578 Other skin changes due to chronic exposure to nonionizing radiation: Secondary | ICD-10-CM | POA: Diagnosis not present

## 2020-01-06 DIAGNOSIS — Z23 Encounter for immunization: Secondary | ICD-10-CM | POA: Diagnosis not present

## 2020-01-24 DIAGNOSIS — Z20822 Contact with and (suspected) exposure to covid-19: Secondary | ICD-10-CM | POA: Diagnosis not present

## 2020-02-02 DIAGNOSIS — R053 Chronic cough: Secondary | ICD-10-CM | POA: Diagnosis not present

## 2020-02-02 DIAGNOSIS — J398 Other specified diseases of upper respiratory tract: Secondary | ICD-10-CM | POA: Diagnosis not present

## 2020-02-02 DIAGNOSIS — T781XXA Other adverse food reactions, not elsewhere classified, initial encounter: Secondary | ICD-10-CM | POA: Diagnosis not present

## 2020-02-09 DIAGNOSIS — H02831 Dermatochalasis of right upper eyelid: Secondary | ICD-10-CM | POA: Diagnosis not present

## 2020-02-09 DIAGNOSIS — H18593 Other hereditary corneal dystrophies, bilateral: Secondary | ICD-10-CM | POA: Diagnosis not present

## 2020-02-09 DIAGNOSIS — H02834 Dermatochalasis of left upper eyelid: Secondary | ICD-10-CM | POA: Diagnosis not present

## 2020-02-09 DIAGNOSIS — H25813 Combined forms of age-related cataract, bilateral: Secondary | ICD-10-CM | POA: Diagnosis not present

## 2020-02-09 DIAGNOSIS — H524 Presbyopia: Secondary | ICD-10-CM | POA: Diagnosis not present

## 2020-02-18 DIAGNOSIS — E7800 Pure hypercholesterolemia, unspecified: Secondary | ICD-10-CM | POA: Diagnosis not present

## 2020-02-18 DIAGNOSIS — R059 Cough, unspecified: Secondary | ICD-10-CM | POA: Diagnosis not present

## 2020-02-18 DIAGNOSIS — Z23 Encounter for immunization: Secondary | ICD-10-CM | POA: Diagnosis not present

## 2020-02-18 DIAGNOSIS — Z1389 Encounter for screening for other disorder: Secondary | ICD-10-CM | POA: Diagnosis not present

## 2020-02-18 DIAGNOSIS — I10 Essential (primary) hypertension: Secondary | ICD-10-CM | POA: Diagnosis not present

## 2020-02-18 DIAGNOSIS — E890 Postprocedural hypothyroidism: Secondary | ICD-10-CM | POA: Diagnosis not present

## 2020-02-18 DIAGNOSIS — D509 Iron deficiency anemia, unspecified: Secondary | ICD-10-CM | POA: Diagnosis not present

## 2020-02-18 DIAGNOSIS — Z0000 Encounter for general adult medical examination without abnormal findings: Secondary | ICD-10-CM | POA: Diagnosis not present

## 2020-02-18 DIAGNOSIS — I2510 Atherosclerotic heart disease of native coronary artery without angina pectoris: Secondary | ICD-10-CM | POA: Diagnosis not present

## 2020-02-22 DIAGNOSIS — I2510 Atherosclerotic heart disease of native coronary artery without angina pectoris: Secondary | ICD-10-CM | POA: Diagnosis not present

## 2020-02-22 DIAGNOSIS — E7800 Pure hypercholesterolemia, unspecified: Secondary | ICD-10-CM | POA: Diagnosis not present

## 2020-02-22 DIAGNOSIS — E890 Postprocedural hypothyroidism: Secondary | ICD-10-CM | POA: Diagnosis not present

## 2020-02-22 DIAGNOSIS — E039 Hypothyroidism, unspecified: Secondary | ICD-10-CM | POA: Diagnosis not present

## 2020-02-22 DIAGNOSIS — D509 Iron deficiency anemia, unspecified: Secondary | ICD-10-CM | POA: Diagnosis not present

## 2020-02-22 DIAGNOSIS — I10 Essential (primary) hypertension: Secondary | ICD-10-CM | POA: Diagnosis not present

## 2020-02-22 DIAGNOSIS — M169 Osteoarthritis of hip, unspecified: Secondary | ICD-10-CM | POA: Diagnosis not present

## 2020-02-22 DIAGNOSIS — M8580 Other specified disorders of bone density and structure, unspecified site: Secondary | ICD-10-CM | POA: Diagnosis not present

## 2020-02-24 DIAGNOSIS — E039 Hypothyroidism, unspecified: Secondary | ICD-10-CM | POA: Diagnosis not present

## 2020-02-29 DIAGNOSIS — K219 Gastro-esophageal reflux disease without esophagitis: Secondary | ICD-10-CM | POA: Diagnosis not present

## 2020-02-29 DIAGNOSIS — R490 Dysphonia: Secondary | ICD-10-CM | POA: Diagnosis not present

## 2020-02-29 DIAGNOSIS — R059 Cough, unspecified: Secondary | ICD-10-CM | POA: Diagnosis not present

## 2020-02-29 DIAGNOSIS — Z79899 Other long term (current) drug therapy: Secondary | ICD-10-CM | POA: Diagnosis not present

## 2020-03-06 DIAGNOSIS — H2512 Age-related nuclear cataract, left eye: Secondary | ICD-10-CM | POA: Diagnosis not present

## 2020-03-06 DIAGNOSIS — H2513 Age-related nuclear cataract, bilateral: Secondary | ICD-10-CM | POA: Diagnosis not present

## 2020-03-21 DIAGNOSIS — H25812 Combined forms of age-related cataract, left eye: Secondary | ICD-10-CM | POA: Diagnosis not present

## 2020-03-21 DIAGNOSIS — H268 Other specified cataract: Secondary | ICD-10-CM | POA: Diagnosis not present

## 2020-03-21 DIAGNOSIS — H2512 Age-related nuclear cataract, left eye: Secondary | ICD-10-CM | POA: Diagnosis not present

## 2020-03-27 DIAGNOSIS — H2511 Age-related nuclear cataract, right eye: Secondary | ICD-10-CM | POA: Diagnosis not present

## 2020-04-05 DIAGNOSIS — L821 Other seborrheic keratosis: Secondary | ICD-10-CM | POA: Diagnosis not present

## 2020-04-05 DIAGNOSIS — D2271 Melanocytic nevi of right lower limb, including hip: Secondary | ICD-10-CM | POA: Diagnosis not present

## 2020-04-05 DIAGNOSIS — D2272 Melanocytic nevi of left lower limb, including hip: Secondary | ICD-10-CM | POA: Diagnosis not present

## 2020-04-05 DIAGNOSIS — D2230 Melanocytic nevi of unspecified part of face: Secondary | ICD-10-CM | POA: Diagnosis not present

## 2020-04-05 DIAGNOSIS — D225 Melanocytic nevi of trunk: Secondary | ICD-10-CM | POA: Diagnosis not present

## 2020-04-05 DIAGNOSIS — Z86018 Personal history of other benign neoplasm: Secondary | ICD-10-CM | POA: Diagnosis not present

## 2020-04-05 DIAGNOSIS — D485 Neoplasm of uncertain behavior of skin: Secondary | ICD-10-CM | POA: Diagnosis not present

## 2020-04-05 DIAGNOSIS — Z85820 Personal history of malignant melanoma of skin: Secondary | ICD-10-CM | POA: Diagnosis not present

## 2020-04-05 DIAGNOSIS — Z85828 Personal history of other malignant neoplasm of skin: Secondary | ICD-10-CM | POA: Diagnosis not present

## 2020-04-05 DIAGNOSIS — L578 Other skin changes due to chronic exposure to nonionizing radiation: Secondary | ICD-10-CM | POA: Diagnosis not present

## 2020-04-05 DIAGNOSIS — D2372 Other benign neoplasm of skin of left lower limb, including hip: Secondary | ICD-10-CM | POA: Diagnosis not present

## 2020-04-06 DIAGNOSIS — T781XXA Other adverse food reactions, not elsewhere classified, initial encounter: Secondary | ICD-10-CM | POA: Diagnosis not present

## 2020-04-06 DIAGNOSIS — R030 Elevated blood-pressure reading, without diagnosis of hypertension: Secondary | ICD-10-CM | POA: Diagnosis not present

## 2020-04-06 DIAGNOSIS — R053 Chronic cough: Secondary | ICD-10-CM | POA: Diagnosis not present

## 2020-04-11 DIAGNOSIS — E890 Postprocedural hypothyroidism: Secondary | ICD-10-CM | POA: Diagnosis not present

## 2020-04-18 DIAGNOSIS — H2511 Age-related nuclear cataract, right eye: Secondary | ICD-10-CM | POA: Diagnosis not present

## 2020-04-18 DIAGNOSIS — H25811 Combined forms of age-related cataract, right eye: Secondary | ICD-10-CM | POA: Diagnosis not present

## 2020-04-18 DIAGNOSIS — H268 Other specified cataract: Secondary | ICD-10-CM | POA: Diagnosis not present

## 2020-04-21 DIAGNOSIS — C434 Malignant melanoma of scalp and neck: Secondary | ICD-10-CM | POA: Diagnosis not present

## 2020-06-02 DIAGNOSIS — E890 Postprocedural hypothyroidism: Secondary | ICD-10-CM | POA: Diagnosis not present

## 2020-06-02 DIAGNOSIS — E039 Hypothyroidism, unspecified: Secondary | ICD-10-CM | POA: Diagnosis not present

## 2020-06-02 DIAGNOSIS — F10239 Alcohol dependence with withdrawal, unspecified: Secondary | ICD-10-CM | POA: Diagnosis not present

## 2020-06-02 DIAGNOSIS — R519 Headache, unspecified: Secondary | ICD-10-CM | POA: Diagnosis not present

## 2020-06-02 DIAGNOSIS — Z743 Need for continuous supervision: Secondary | ICD-10-CM | POA: Diagnosis not present

## 2020-06-02 DIAGNOSIS — Z85820 Personal history of malignant melanoma of skin: Secondary | ICD-10-CM | POA: Diagnosis not present

## 2020-06-02 DIAGNOSIS — E785 Hyperlipidemia, unspecified: Secondary | ICD-10-CM | POA: Diagnosis not present

## 2020-06-02 DIAGNOSIS — R29700 NIHSS score 0: Secondary | ICD-10-CM | POA: Diagnosis not present

## 2020-06-02 DIAGNOSIS — R413 Other amnesia: Secondary | ICD-10-CM | POA: Diagnosis not present

## 2020-06-02 DIAGNOSIS — R32 Unspecified urinary incontinence: Secondary | ICD-10-CM | POA: Diagnosis not present

## 2020-06-02 DIAGNOSIS — R569 Unspecified convulsions: Secondary | ICD-10-CM | POA: Diagnosis not present

## 2020-06-02 DIAGNOSIS — C439 Malignant melanoma of skin, unspecified: Secondary | ICD-10-CM | POA: Diagnosis not present

## 2020-06-02 DIAGNOSIS — Z87891 Personal history of nicotine dependence: Secondary | ICD-10-CM | POA: Diagnosis not present

## 2020-06-02 DIAGNOSIS — Z20822 Contact with and (suspected) exposure to covid-19: Secondary | ICD-10-CM | POA: Diagnosis not present

## 2020-06-02 DIAGNOSIS — Z79890 Hormone replacement therapy (postmenopausal): Secondary | ICD-10-CM | POA: Diagnosis not present

## 2020-06-02 DIAGNOSIS — F419 Anxiety disorder, unspecified: Secondary | ICD-10-CM | POA: Diagnosis not present

## 2020-06-02 DIAGNOSIS — E049 Nontoxic goiter, unspecified: Secondary | ICD-10-CM | POA: Diagnosis not present

## 2020-06-02 DIAGNOSIS — I161 Hypertensive emergency: Secondary | ICD-10-CM | POA: Diagnosis not present

## 2020-06-02 DIAGNOSIS — Z888 Allergy status to other drugs, medicaments and biological substances status: Secondary | ICD-10-CM | POA: Diagnosis not present

## 2020-06-13 DIAGNOSIS — L988 Other specified disorders of the skin and subcutaneous tissue: Secondary | ICD-10-CM | POA: Diagnosis not present

## 2020-06-13 DIAGNOSIS — D485 Neoplasm of uncertain behavior of skin: Secondary | ICD-10-CM | POA: Diagnosis not present

## 2020-06-20 ENCOUNTER — Encounter: Payer: Self-pay | Admitting: Neurology

## 2020-06-30 ENCOUNTER — Ambulatory Visit: Payer: PPO | Admitting: Neurology

## 2020-06-30 ENCOUNTER — Other Ambulatory Visit: Payer: Self-pay

## 2020-06-30 ENCOUNTER — Encounter: Payer: Self-pay | Admitting: Neurology

## 2020-06-30 VITALS — BP 149/87 | HR 70 | Ht 61.5 in | Wt 120.2 lb

## 2020-06-30 DIAGNOSIS — R569 Unspecified convulsions: Secondary | ICD-10-CM

## 2020-06-30 MED ORDER — LEVETIRACETAM 500 MG PO TABS: 500.0000 mg | ORAL_TABLET | Freq: Two times a day (BID) | ORAL | 6 refills | Status: DC

## 2020-06-30 NOTE — Progress Notes (Signed)
NEUROLOGY CONSULTATION NOTE  Misty Woods MRN: [id number] DOB: June 02, 1945  Referring provider: Dr. Lavone Orn Primary care provider: Dr. Lavone Orn  Reason for consult:  seizure  Dear Dr Laurann Montana:  Thank you for your kind referral of Misty Woods for consultation of the above symptoms. Although her history is well known to you, please allow me to reiterate it for the purpose of our medical record. The patient was accompanied to the clinic by her husband who also provides collateral information. Records and images were personally reviewed where available.  HISTORY OF PRESENT ILLNESS: This is a 75 year old right-handed woman with a history of hyperlipidemia, hypothyroidism s/p thyroidectomy, malignant melanoma s/p resection, with new onset seizure on 06/04/20 while on vacation in [place]. She was brought to [facility], records were reviewed. She reported having a significant headache and light sensitivity prior to the seizure and bit her tongue. MRI brain no acute changes. She was unable to do EEG. She was given Keppra and discharged home on Keppra 750mg  BID. She states that she woke up feeling fine, she recalls sitting by the pool then when walked by the kitchen, she had trouble seeing and light blinded her for a second. She does not recall sitting down then woke up in the hospital with a bruise on her lower abdomen. Her husband notes she kept asking the same questions over and over. Today she denies having any headaches and states she very rarely gets headaches. She and her husband deny any staring/unresponsiveness, olfactory/gustatory hallucinations, deja vu, rising epigastric sensation, focal numbness/tingling/weakness, myoclonic jerks. She has had a chronic cough. She states the only medication change was a couple of months ago for her thyroid medication. She denies any alcohol intake, no sleep deprivation. She has occasional double vision.  No dysarthria/dysphagia, neck/back pain, bowel/bladder dysfunction. When asked about memory, she states "I think I'm losing it." She denies missing medications. The Levetiracetam makes her a little dizzy, not quite as sleepy as before. She was previously weaned off Gabapentin but restarted it December 2021 for chronic cough. She had a normal birth and early development.  There is no history of febrile convulsions, CNS infections such as meningitis/encephalitis, significant traumatic brain injury, neurosurgical procedures, or family history of seizures.   PAST MEDICAL HISTORY: Past Medical History:  Diagnosis Date   Chronic cough    states "idiopathic"   Dental crowns present    x 2   High cholesterol    Hypothyroidism    Stenosing tenosynovitis of finger of right hand 12/2017   right third finger    PAST SURGICAL HISTORY: Past Surgical History:  Procedure Laterality Date   74 HOUR [place] STUDY N/A 07/29/2016   Procedure: 24 HOUR PH STUDY;  Surgeon: Otis Brace, MD;  Location: [facility];  Service: Gastroenterology;  Laterality: N/A;   ABDOMINAL HYSTERECTOMY     complete   APPENDECTOMY     ESOPHAGEAL MANOMETRY N/A 07/29/2016   Procedure: ESOPHAGEAL MANOMETRY (EM);  Surgeon: Otis Brace, MD;  Location: [facility];  Service: Gastroenterology;  Laterality: N/A;   THYROIDECTOMY  06/10/2005   THYROIDECTOMY, PARTIAL Left 1998   TONSILLECTOMY AND ADENOIDECTOMY     TRIGGER FINGER RELEASE Right 12/11/2017   Procedure: RELEASE TRIGGER FINGER/A-1 PULLEY RIGHT MIDDLE FINGER;  Surgeon: Daryll Brod, MD;  Location: [place];  Service: Orthopedics;  Laterality: Right;   VIDEO BRONCHOSCOPY Bilateral 04/11/2016   Procedure: VIDEO BRONCHOSCOPY WITHOUT FLUORO;  Surgeon: [name], MD;  Location: [facility];  Service: Cardiopulmonary;  Laterality: Bilateral;    MEDICATIONS: Current Outpatient Medications on File Prior to Visit  Medication Sig Dispense Refill   aspirin  EC 81 MG tablet Take 81 mg by mouth daily.     calcium citrate-vitamin D (CITRACAL+D) 315-200 MG-UNIT tablet Take by mouth.     Cholecalciferol (VITAMIN D3) 5000 units CAPS Take 1 capsule by mouth daily.     cyanocobalamin 100 MCG tablet Take by mouth.     gabapentin (NEURONTIN) 300 MG capsule TAKE 2 CAPSULES (=600MG )   TWICE DAILY (Patient taking differently: 4 (four) times daily.) 360 capsule 3   levETIRAcetam (KEPPRA) 750 MG tablet Take 750 mg by mouth 2 (two) times daily.     levothyroxine (SYNTHROID) 75 MCG tablet Take 100 mcg by mouth daily before breakfast.     Magnesium 300 MG CAPS 3 (three) times a week.     nortriptyline (PAMELOR) 25 MG capsule Take 25 mg by mouth at bedtime.     rosuvastatin (CRESTOR) 20 MG tablet Take 1 tablet (20 mg total) by mouth daily. 90 tablet 3   No current facility-administered medications on file prior to visit.    ALLERGIES: Allergies  Allergen Reactions   Propofol Other (See Comments)    When propofol was injected into her vein during colonoscopy pt experienced severe burning feeling up entire arm, pt says she never wants propofol again   Tramadol Hcl Other (See Comments)    DIZZINESS   Hydrocodone     "loopy"   Ketorolac Other (See Comments)   Darvon [Propoxyphene] Rash   Tramadol Hcl Rash    REACTION: dizziness    FAMILY HISTORY: Family History  Problem Relation Age of Onset   Allergic rhinitis Brother    Asthma Brother     SOCIAL HISTORY: Social History   Socioeconomic History   Marital status: Married    Spouse name: Not on file   Number of children: Not on file   Years of education: Not on file   Highest education level: Not on file  Occupational History   Not on file  Tobacco Use   Smoking status: Former Smoker    Types: Cigarettes    Quit date: 02/03/1969    Years since quitting: 51.4   Smokeless tobacco: Never Used  Scientific laboratory technician Use: Never used  Substance and Sexual Activity   Alcohol use: Not Currently     Comment: socially   Drug use: No   Sexual activity: Not on file  Other Topics Concern   Not on file  Social History Narrative   Right handed   Lives with family    Social Determinants of Health   Financial Resource Strain: Not on file  Food Insecurity: Not on file  Transportation Needs: Not on file  Physical Activity: Not on file  Stress: Not on file  Social Connections: Not on file  Intimate Partner Violence: Not on file     PHYSICAL EXAM: Vitals:   06/30/20 1247  BP: (!) 149/87  Pulse: 70  SpO2: 99%   General: No acute distress Head:  Normocephalic/atraumatic Skin/Extremities: No rash, no edema Neurological Exam: Mental status: alert and oriented to person, place, and time, no dysarthria or aphasia, Fund of knowledge is appropriate.  Recent and remote memory are intact, 3/3 delayed recall.  Attention and concentration are normal, 5/5 WORLD backwards.  Cranial nerves: CN I: not tested CN II: pupils equal, round and reactive to light, visual fields  intact CN III, IV, VI:  full range of motion, no nystagmus, no ptosis CN V: facial sensation intact CN VII: upper and lower face symmetric CN VIII: hearing intact to conversation Bulk & Tone: normal, no fasciculations. Motor: 5/5 throughout with no pronator drift. Sensation: intact to light touch, cold, pin, vibration and joint position sense.  No extinction to double simultaneous stimulation.  Romberg test negative Deep Tendon Reflexes: +2 on both UE, +1 both LE Cerebellar: no incoordination on finger to nose testing Gait: narrow-based and steady, able to tandem walk adequately. Tremor: none   IMPRESSION: This is a 75 year old right-handed woman with a history of hyperlipidemia, hypothyroidism s/p thyroidectomy, malignant melanoma s/p resection, with new onset seizure on 06/04/20 while on vacation in [place]. Her neurological exam is normal, no clear epilepsy risk factors. Etiology unclear, brain MRI normal. A 1-hour  EEG will be ordered, and if normal, we will do a 24-hour EEG for classification. She is having side effects on the Levetiracetam, reduce to 500mg  BID.  [place] driving laws were discussed with the patient, and she knows to stop driving after a seizure, until 6 months seizure-free. Follow-up after tests, they know to call for any changes.   Thank you for allowing me to participate in the care of this patient. Please do not hesitate to call for any questions or concerns.   Ellouise Newer, M.D.  CC: Dr. Laurann Montana

## 2020-06-30 NOTE — Patient Instructions (Signed)
1. Schedule 1-hour EEG, if normal we will do a 24-hour EEG  2. Reduce Keppra to 500mg : take 1 tablet twice a day  3. Follow-up after tests, call for any changes   Seizure Precautions: 1. If medication has been prescribed for you to prevent seizures, take it exactly as directed.  Do not stop taking the medicine without talking to your doctor first, even if you have not had a seizure in a long time.   2. Avoid activities in which a seizure would cause danger to yourself or to others.  Don't operate dangerous machinery, swim alone, or climb in high or dangerous places, such as on ladders, roofs, or girders.  Do not drive unless your doctor says you may.  3. If you have any warning that you may have a seizure, lay down in a safe place where you can't hurt yourself.    4.  No driving for 6 months from last seizure, as per [facility].   Please refer to the following link on the [place] website for more information: http://www.epilepsyfoundation.org/answerplace/Social/driving/drivingu.cfm   5.  Maintain good sleep hygiene. Avoid alcohol.  6.  Contact your doctor if you have any problems that may be related to the medicine you are taking.  7.  Call 911 and bring the patient back to the ED if:        A.  The seizure lasts longer than 5 minutes.       B.  The patient doesn't awaken shortly after the seizure  C.  The patient has new problems such as difficulty seeing, speaking or moving  D.  The patient was injured during the seizure  E.  The patient has a temperature over 102 F (39C)  F.  The patient vomited and now is having trouble breathing

## 2020-07-12 DIAGNOSIS — R030 Elevated blood-pressure reading, without diagnosis of hypertension: Secondary | ICD-10-CM | POA: Diagnosis not present

## 2020-07-12 DIAGNOSIS — R569 Unspecified convulsions: Secondary | ICD-10-CM | POA: Diagnosis not present

## 2020-07-12 DIAGNOSIS — T781XXA Other adverse food reactions, not elsewhere classified, initial encounter: Secondary | ICD-10-CM | POA: Diagnosis not present

## 2020-07-12 DIAGNOSIS — T50905S Adverse effect of unspecified drugs, medicaments and biological substances, sequela: Secondary | ICD-10-CM | POA: Diagnosis not present

## 2020-07-13 ENCOUNTER — Other Ambulatory Visit: Payer: Self-pay

## 2020-07-13 ENCOUNTER — Ambulatory Visit: Payer: PPO | Admitting: Neurology

## 2020-07-13 DIAGNOSIS — R569 Unspecified convulsions: Secondary | ICD-10-CM | POA: Diagnosis not present

## 2020-07-14 DIAGNOSIS — L578 Other skin changes due to chronic exposure to nonionizing radiation: Secondary | ICD-10-CM | POA: Diagnosis not present

## 2020-07-14 DIAGNOSIS — L650 Telogen effluvium: Secondary | ICD-10-CM | POA: Diagnosis not present

## 2020-07-14 DIAGNOSIS — Z85820 Personal history of malignant melanoma of skin: Secondary | ICD-10-CM | POA: Diagnosis not present

## 2020-07-14 DIAGNOSIS — L821 Other seborrheic keratosis: Secondary | ICD-10-CM | POA: Diagnosis not present

## 2020-07-14 DIAGNOSIS — D225 Melanocytic nevi of trunk: Secondary | ICD-10-CM | POA: Diagnosis not present

## 2020-07-14 DIAGNOSIS — D2272 Melanocytic nevi of left lower limb, including hip: Secondary | ICD-10-CM | POA: Diagnosis not present

## 2020-07-14 DIAGNOSIS — D2372 Other benign neoplasm of skin of left lower limb, including hip: Secondary | ICD-10-CM | POA: Diagnosis not present

## 2020-07-14 DIAGNOSIS — Z85828 Personal history of other malignant neoplasm of skin: Secondary | ICD-10-CM | POA: Diagnosis not present

## 2020-07-14 DIAGNOSIS — D2271 Melanocytic nevi of right lower limb, including hip: Secondary | ICD-10-CM | POA: Diagnosis not present

## 2020-07-14 DIAGNOSIS — Z86018 Personal history of other benign neoplasm: Secondary | ICD-10-CM | POA: Diagnosis not present

## 2020-07-14 DIAGNOSIS — D2230 Melanocytic nevi of unspecified part of face: Secondary | ICD-10-CM | POA: Diagnosis not present

## 2020-07-17 ENCOUNTER — Telehealth: Payer: Self-pay | Admitting: Neurology

## 2020-07-17 ENCOUNTER — Telehealth: Payer: Self-pay

## 2020-07-17 NOTE — Telephone Encounter (Signed)
Pt called and informed that EEG was normal, proceed with prolonged EEG as scheduled

## 2020-07-17 NOTE — Telephone Encounter (Signed)
See result notes. 

## 2020-07-17 NOTE — Procedures (Signed)
ELECTROENCEPHALOGRAM REPORT  Date of Study: 07/13/2020  Patient's Name: Misty Woods MRN: [id number] Date of Birth: 02-22-1945  Referring Provider: Dr. Ellouise Newer  Clinical History: This is a 75 year old woman with new onset seizure on 06/04/2020. EEG for classification.  Medications: KEPPRA 750 MG tablet NEURONTIN 300 MG capsule aspirin EC 81 MG tablet CITRACAL+D 315-200 MG-UNIT tablet VITAMIN D3 5000 units CAPS SYNTHROID 75 MCG tablet PAMELOR 25 MG capsule Magnesium 300 MG CAPS CRESTOR 20 MG tablet  Technical Summary: A multichannel digital 1-hour EEG recording measured by the international 10-20 system with electrodes applied with paste and impedances below 5000 ohms performed in our laboratory with EKG monitoring in an awake and asleep patient.  Hyperventilation was not performed. Photic stimulation was performed.  The digital EEG was referentially recorded, reformatted, and digitally filtered in a variety of bipolar and referential montages for optimal display.    Description: The patient is awake and asleep during the recording.  During maximal wakefulness, there is a symmetric, medium voltage 8 Hz posterior dominant rhythm that attenuates with eye opening.  The record is symmetric.  During drowsiness and sleep, there is an increase in theta slowing of the background.  Vertex waves and symmetric sleep spindles were seen. Photic stimulation did not elicit any abnormalities.  There were no epileptiform discharges or electrographic seizures seen.    EKG lead was unremarkable.  Impression: This 1-hour awake and asleep EEG is normal.    Clinical Correlation: A normal EEG does not exclude a clinical diagnosis of epilepsy.  If further clinical questions remain, prolonged EEG may be helpful.  Clinical correlation is advised.   Ellouise Newer, M.D.

## 2020-07-17 NOTE — Telephone Encounter (Signed)
-----   Message from Cameron Sprang, MD sent at 07/17/2020 11:48 AM EDT ----- Pls let her know the EEG was normal, proceed with prolonged EEG as scheduled, thanks

## 2020-07-17 NOTE — Telephone Encounter (Signed)
Misty Woods would like a call back from the nurse regarding her results.

## 2020-07-26 DIAGNOSIS — M169 Osteoarthritis of hip, unspecified: Secondary | ICD-10-CM | POA: Diagnosis not present

## 2020-07-26 DIAGNOSIS — E7800 Pure hypercholesterolemia, unspecified: Secondary | ICD-10-CM | POA: Diagnosis not present

## 2020-07-26 DIAGNOSIS — I10 Essential (primary) hypertension: Secondary | ICD-10-CM | POA: Diagnosis not present

## 2020-07-26 DIAGNOSIS — M8580 Other specified disorders of bone density and structure, unspecified site: Secondary | ICD-10-CM | POA: Diagnosis not present

## 2020-07-26 DIAGNOSIS — E039 Hypothyroidism, unspecified: Secondary | ICD-10-CM | POA: Diagnosis not present

## 2020-07-26 DIAGNOSIS — D509 Iron deficiency anemia, unspecified: Secondary | ICD-10-CM | POA: Diagnosis not present

## 2020-07-26 DIAGNOSIS — I2510 Atherosclerotic heart disease of native coronary artery without angina pectoris: Secondary | ICD-10-CM | POA: Diagnosis not present

## 2020-07-26 DIAGNOSIS — E890 Postprocedural hypothyroidism: Secondary | ICD-10-CM | POA: Diagnosis not present

## 2020-07-31 ENCOUNTER — Ambulatory Visit: Payer: Medicare Other | Admitting: Neurology

## 2020-08-01 DIAGNOSIS — Z1231 Encounter for screening mammogram for malignant neoplasm of breast: Secondary | ICD-10-CM | POA: Diagnosis not present

## 2020-08-02 ENCOUNTER — Other Ambulatory Visit: Payer: Self-pay

## 2020-08-02 ENCOUNTER — Ambulatory Visit (INDEPENDENT_AMBULATORY_CARE_PROVIDER_SITE_OTHER): Payer: PPO | Admitting: Neurology

## 2020-08-02 DIAGNOSIS — R569 Unspecified convulsions: Secondary | ICD-10-CM

## 2020-08-09 NOTE — Procedures (Signed)
ELECTROENCEPHALOGRAM REPORT  Dates of Recording: 08/02/2020 11:02AM to 08/03/2020 11:01AM  Patient's Name: Misty Woods MRN: [id number] Date of Birth: 10/21/1945  Referring Provider: Dr. Ellouise Newer  Procedure: 24-hour ambulatory video EEG  History: This is a 75 year old woman with new onset seizure on 06/04/20.   Medications:  KEPPRA 750 MG tablet NEURONTIN 300 MG capsule aspirin EC 81 MG tablet CITRACAL+D 315-200 MG-UNIT tablet VITAMIN D3 5000 units CAPS SYNTHROID 75 MCG tablet PAMELOR 25 MG capsule Magnesium 300 MG CAPS CRESTOR 20 MG tablet  Technical Summary: This is a 24-hour multichannel digital video EEG recording measured by the international 10-20 system with electrodes applied with paste and impedances below 5000 ohms performed as portable with EKG monitoring.  The digital EEG was referentially recorded, reformatted, and digitally filtered in a variety of bipolar and referential montages for optimal display.    DESCRIPTION OF RECORDING: During maximal wakefulness, the background activity consisted of a symmetric 9 Hz posterior dominant rhythm which was reactive to eye opening.  There were no epileptiform discharges or focal slowing seen in wakefulness.  During the recording, the patient progresses through wakefulness, drowsiness, and Stage 2 sleep.  Again, there were no epileptiform discharges seen.  Events: On 6/29 at 1519, 2048, 2057, 2135, 2250 hours, she reports cough. Patient is seen sitting on the bed with no clinical changes seen on video aside from coughing. Electrographically, there were no EEG or EKG changes seen.  On 6/30 at 0855, 0945, and 1045 hours, she reports headache. Patient not on video. Electrographically, there were no EEG or EKG changes seen.   There were no electrographic seizures seen.  EKG lead was unremarkable.  IMPRESSION: This 24-hour ambulatory video EEG study is normal.    CLINICAL CORRELATION: A normal EEG does not exclude a  clinical diagnosis of epilepsy. Typical events were not captured. If further clinical questions remain, inpatient video EEG monitoring may be helpful.   Ellouise Newer, M.D.

## 2020-08-09 NOTE — Telephone Encounter (Signed)
Pt would like a call back regarding results

## 2020-08-09 NOTE — Telephone Encounter (Signed)
Pt called no answer left a voice mail to call back when she calls back let her know that .Marland Kitchen Pt called and informed of results 3 weeks ago EEG was normal, proceed with prolonged EEG as scheduled,

## 2020-08-09 NOTE — Telephone Encounter (Signed)
Pls let her know the 24-hour EEG was normal. How is she feeling on the lower dose of Keppra 500mg  BID? Would stay on same dose and f/u in November. Pls let front staff know to open up 2pm slot on Nov 7 for f/u with me. Thanks!

## 2020-08-10 NOTE — Telephone Encounter (Signed)
Pt called no answer left a voice mail to call office back

## 2020-08-11 ENCOUNTER — Telehealth: Payer: Self-pay | Admitting: Neurology

## 2020-08-11 NOTE — Telephone Encounter (Signed)
See other phone note

## 2020-08-11 NOTE — Telephone Encounter (Signed)
Pt called and informed that  24-hour EEG was normal. How is she feeling on the lower dose of Keppra 500mg  BID? Pt is doing well on the keppra. Pt informed that Dr Delice Lesch Would like for her to stay on same dose and f/u in November. Pt will be out of town on Nov 7th she will not be back until the 15th. Pt advised that we will have the front desk call her back to get something scheduled

## 2020-08-11 NOTE — Telephone Encounter (Signed)
Pt is returning [facility] to [facility], pt said she will be home now.

## 2020-08-11 NOTE — Telephone Encounter (Signed)
Pt called in again, wanting to speak with Heather.

## 2020-09-07 ENCOUNTER — Ambulatory Visit: Payer: Medicare Other | Admitting: Neurology

## 2020-09-18 ENCOUNTER — Telehealth: Payer: Self-pay | Admitting: Neurology

## 2020-09-18 NOTE — Telephone Encounter (Signed)
Pt called in stating she has been taking Keppra and they are making her very sleepy. She can barely keep her eyes open. Could she cut them in half and take half the dose? She stated if she is not home someone can speak with her husband.

## 2020-09-19 ENCOUNTER — Telehealth: Payer: Self-pay

## 2020-09-19 MED ORDER — LEVETIRACETAM ER 500 MG PO TB24: 1000.0000 mg | ORAL_TABLET | Freq: Every day | ORAL | 1 refills | Status: DC

## 2020-09-19 NOTE — Telephone Encounter (Signed)
Spoke with pt and informed her that Dr. Delice Lesch has already reduced it to lowest effective dosage.  Per Dr Tat could potentially try changing it to Keppra XR, 500 mg and having her take both tablets at night and see if better. Pt would like to try the Keppra XR a new script was called in for the pt

## 2020-09-19 NOTE — Telephone Encounter (Signed)
Pt called and informed that Dr. Delice Lesch has already reduced it to lowest effective dosage.   per Dr Carles Collet We could potentially try changing it to Keppra XR, 500 mg and having her take both tablets at night and see if better. Pt would like to try Keppra XR new script sent to [facility]

## 2020-09-19 NOTE — Telephone Encounter (Signed)
Patient called and left a voice mail stating she still has not heard back from the office about a question she has about her medications.

## 2020-09-25 ENCOUNTER — Telehealth: Payer: Self-pay | Admitting: Neurology

## 2020-09-25 NOTE — Telephone Encounter (Signed)
Pt called and informed we had changed her medication to the Keppra XR, 500 mg and having her take both tablets at night and see if she felt better because she stated that taken it BID made her sleepy. Pt asked if she could split the pills taken one in the morning and one at night Pt Advised that is what we had just changed it from she stated she would try to take them both at night if she has any trouble she will call us back,

## 2020-09-25 NOTE — Telephone Encounter (Signed)
Pt stated she was told to take 2 keppra ER at night, but she is afraid to take them. She's afraid she wont wake up. She would like some advise.

## 2020-10-02 DIAGNOSIS — I2510 Atherosclerotic heart disease of native coronary artery without angina pectoris: Secondary | ICD-10-CM | POA: Diagnosis not present

## 2020-10-02 DIAGNOSIS — E7800 Pure hypercholesterolemia, unspecified: Secondary | ICD-10-CM | POA: Diagnosis not present

## 2020-10-02 DIAGNOSIS — E039 Hypothyroidism, unspecified: Secondary | ICD-10-CM | POA: Diagnosis not present

## 2020-10-02 DIAGNOSIS — M8580 Other specified disorders of bone density and structure, unspecified site: Secondary | ICD-10-CM | POA: Diagnosis not present

## 2020-10-02 DIAGNOSIS — M169 Osteoarthritis of hip, unspecified: Secondary | ICD-10-CM | POA: Diagnosis not present

## 2020-10-02 DIAGNOSIS — I10 Essential (primary) hypertension: Secondary | ICD-10-CM | POA: Diagnosis not present

## 2020-10-02 DIAGNOSIS — E890 Postprocedural hypothyroidism: Secondary | ICD-10-CM | POA: Diagnosis not present

## 2020-10-02 DIAGNOSIS — D509 Iron deficiency anemia, unspecified: Secondary | ICD-10-CM | POA: Diagnosis not present

## 2020-10-06 DIAGNOSIS — C434 Malignant melanoma of scalp and neck: Secondary | ICD-10-CM | POA: Diagnosis not present

## 2020-10-11 ENCOUNTER — Other Ambulatory Visit: Payer: Self-pay | Admitting: Student in an Organized Health Care Education/Training Program

## 2020-10-11 ENCOUNTER — Other Ambulatory Visit: Payer: Self-pay | Admitting: General Surgery

## 2020-10-11 DIAGNOSIS — C434 Malignant melanoma of scalp and neck: Secondary | ICD-10-CM

## 2020-10-12 DIAGNOSIS — K5900 Constipation, unspecified: Secondary | ICD-10-CM | POA: Diagnosis not present

## 2020-10-12 DIAGNOSIS — T781XXA Other adverse food reactions, not elsewhere classified, initial encounter: Secondary | ICD-10-CM | POA: Diagnosis not present

## 2020-10-12 DIAGNOSIS — K219 Gastro-esophageal reflux disease without esophagitis: Secondary | ICD-10-CM | POA: Diagnosis not present

## 2020-10-13 DIAGNOSIS — I6782 Cerebral ischemia: Secondary | ICD-10-CM | POA: Diagnosis not present

## 2020-10-13 DIAGNOSIS — C434 Malignant melanoma of scalp and neck: Secondary | ICD-10-CM | POA: Diagnosis not present

## 2020-11-29 DIAGNOSIS — H18593 Other hereditary corneal dystrophies, bilateral: Secondary | ICD-10-CM | POA: Diagnosis not present

## 2020-11-29 DIAGNOSIS — H02831 Dermatochalasis of right upper eyelid: Secondary | ICD-10-CM | POA: Diagnosis not present

## 2020-11-29 DIAGNOSIS — Z961 Presence of intraocular lens: Secondary | ICD-10-CM | POA: Diagnosis not present

## 2020-12-04 ENCOUNTER — Ambulatory Visit: Payer: PPO | Admitting: Neurology

## 2020-12-04 ENCOUNTER — Other Ambulatory Visit: Payer: Self-pay

## 2020-12-04 ENCOUNTER — Encounter: Payer: Self-pay | Admitting: Neurology

## 2020-12-04 VITALS — BP 142/85 | HR 75 | Ht 61.5 in | Wt 123.2 lb

## 2020-12-04 DIAGNOSIS — R569 Unspecified convulsions: Secondary | ICD-10-CM | POA: Diagnosis not present

## 2020-12-04 MED ORDER — LEVETIRACETAM 500 MG PO TABS: 500.0000 mg | ORAL_TABLET | Freq: Two times a day (BID) | ORAL | 3 refills | Status: DC

## 2020-12-04 NOTE — Patient Instructions (Signed)
Good to see you!   We can try reducing Keppra 500mg : take 1 tablet every night after your trip. Please update me in a month, depending on how you are feeling, we will plan to stop medication and monitor symptoms.  2. Follow-up in 3 months, call for any changes   Seizure Precautions: 1. If medication has been prescribed for you to prevent seizures, take it exactly as directed.  Do not stop taking the medicine without talking to your doctor first, even if you have not had a seizure in a long time.   2. Avoid activities in which a seizure would cause danger to yourself or to others.  Don't operate dangerous machinery, swim alone, or climb in high or dangerous places, such as on ladders, roofs, or girders.  Do not drive unless your doctor says you may.  3. If you have any warning that you may have a seizure, lay down in a safe place where you can't hurt yourself.    4.  No driving for 6 months from last seizure, as per [facility].   Please refer to the following link on the [place] website for more information: http://www.epilepsyfoundation.org/answerplace/Social/driving/drivingu.cfm   5.  Maintain good sleep hygiene. Avoid alcohol.  6.  Contact your doctor if you have any problems that may be related to the medicine you are taking.  7.  Call 911 and bring the patient back to the ED if:        A.  The seizure lasts longer than 5 minutes.       B.  The patient doesn't awaken shortly after the seizure  C.  The patient has new problems such as difficulty seeing, speaking or moving  D.  The patient was injured during the seizure  E.  The patient has a temperature over 102 F (39C)  F.  The patient vomited and now is having trouble breathing

## 2020-12-04 NOTE — Progress Notes (Signed)
NEUROLOGY FOLLOW UP OFFICE NOTE  Misty Woods [id number] [birth year]  HISTORY OF PRESENT ILLNESS: I had the pleasure of seeing Misty Woods in follow-up in the neurology clinic on 12/04/2020. She is again accompanied by her husband who helps supplement the history today. The patient was last seen 5 months ago for new onset seizure that occurred on 06/04/2020. Records and images were personally reviewed where available.  MRI brain in 06/2020 was normal. Her 1-hour EEG and 24-hour EEG in 07/2020 were normal, typical events were not captured. She was started on Levetiracetam in the hospital, dose reduced to 500mg  BID due to drowsiness reported on last visit. She called with continued drowsiness and was switched to extended-release Levetiracetam 1000mg  qhs. She reports today that she did not like the ER formulation and switched back to Levetiracetam 500mg  BID and is tolerating it better now with no further drowsiness. She and her husband deny any seizures or seizure-like symptoms since 06/04/2020. They deny any staring/unresponsive episodes, gaps in time, olfactory/gustatory hallucinations, focal numbness/tingling/weakness, myoclonic jerks. No headaches, dizziness, vision changes, no falls.    History on Initial Assessment 06/30/2020: This is a 75 year old right-handed woman with a history of hyperlipidemia, hypothyroidism s/p thyroidectomy, malignant melanoma s/p resection, with new onset seizure on 06/04/20 while on vacation in [place]. She was brought to [facility], records were reviewed. She reported having a significant headache and light sensitivity prior to the seizure and bit her tongue. MRI brain no acute changes. She was unable to do EEG. She was given Keppra and discharged home on Keppra 750mg  BID. She states that she woke up feeling fine, she recalls sitting by the pool then when walked by the kitchen, she had trouble seeing and light blinded her for a second.  She does not recall sitting down then woke up in the hospital with a bruise on her lower abdomen. Her husband notes she kept asking the same questions over and over. Today she denies having any headaches and states she very rarely gets headaches. She and her husband deny any staring/unresponsiveness, olfactory/gustatory hallucinations, deja vu, rising epigastric sensation, focal numbness/tingling/weakness, myoclonic jerks. She has had a chronic cough. She states the only medication change was a couple of months ago for her thyroid medication. She denies any alcohol intake, no sleep deprivation. She has occasional double vision. No dysarthria/dysphagia, neck/back pain, bowel/bladder dysfunction. When asked about memory, she states "I think I'm losing it." She denies missing medications. The Levetiracetam makes her a little dizzy, not quite as sleepy as before. She was previously weaned off Gabapentin but restarted it December 2021 for chronic cough. She had a normal birth and early development.  There is no history of febrile convulsions, CNS infections such as meningitis/encephalitis, significant traumatic brain injury, neurosurgical procedures, or family history of seizures.   PAST MEDICAL HISTORY: Past Medical History:  Diagnosis Date   Chronic cough    states "idiopathic"   Dental crowns present    x 2   High cholesterol    Hypothyroidism    Stenosing tenosynovitis of finger of right hand 12/2017   right third finger    MEDICATIONS: Current Outpatient Medications on File Prior to Visit  Medication Sig Dispense Refill   aspirin EC 81 MG tablet Take 81 mg by mouth daily.     calcium citrate-vitamin D (CITRACAL+D) 315-200 MG-UNIT tablet Take by mouth.     Cholecalciferol (VITAMIN D3) 5000 units CAPS Take 1 capsule by mouth daily.  cyanocobalamin 100 MCG tablet Take by mouth.     gabapentin (NEURONTIN) 300 MG capsule TAKE 2 CAPSULES (=600MG )   TWICE DAILY (Patient taking differently: 4  (four) times daily.) 360 capsule 3   levETIRAcetam (KEPPRA XR) 500 MG 24 hr tablet Take 2 tablets (1,000 mg total) by mouth at bedtime. 60 tablet 1   levothyroxine (SYNTHROID) 75 MCG tablet Take 100 mcg by mouth daily before breakfast.     Magnesium 300 MG CAPS 3 (three) times a week.     nortriptyline (PAMELOR) 25 MG capsule Take 25 mg by mouth at bedtime.     rosuvastatin (CRESTOR) 20 MG tablet Take 1 tablet (20 mg total) by mouth daily. 90 tablet 3   No current facility-administered medications on file prior to visit.    ALLERGIES: Allergies  Allergen Reactions   Propofol Other (See Comments)    When propofol was injected into her vein during colonoscopy pt experienced severe burning feeling up entire arm, pt says she never wants propofol again   Tramadol Hcl Other (See Comments)    DIZZINESS   Hydrocodone     "loopy"   Ketorolac Other (See Comments)   Darvon [Propoxyphene] Rash   Tramadol Hcl Rash    REACTION: dizziness    FAMILY HISTORY: Family History  Problem Relation Age of Onset   Allergic rhinitis Brother    Asthma Brother     SOCIAL HISTORY: Social History   Socioeconomic History   Marital status: Married    Spouse name: Not on file   Number of children: Not on file   Years of education: Not on file   Highest education level: Not on file  Occupational History   Not on file  Tobacco Use   Smoking status: Former    Types: Cigarettes    Quit date: 02/03/1969    Years since quitting: 51.8   Smokeless tobacco: Never  Vaping Use   Vaping Use: Never used  Substance and Sexual Activity   Alcohol use: Not Currently    Comment: socially   Drug use: No   Sexual activity: Not on file  Other Topics Concern   Not on file  Social History Narrative   Right handed   Lives with family    Social Determinants of Health   Financial Resource Strain: Not on file  Food Insecurity: Not on file  Transportation Needs: Not on file  Physical Activity: Not on file   Stress: Not on file  Social Connections: Not on file  Intimate Partner Violence: Not on file     PHYSICAL EXAM: Vitals:   12/04/20 1122  BP: (!) 142/85  Pulse: 75  SpO2: 98%   General: No acute distress Head:  Normocephalic/atraumatic Skin/Extremities: No rash, no edema Neurological Exam: alert and awake. No aphasia or dysarthria. Fund of knowledge is appropriate.  Recent and remote memory are intact.  Attention and concentration are normal.   Cranial nerves: Pupils equal, round. Extraocular movements intact with no nystagmus. Visual fields full.  No facial asymmetry.  Motor: Bulk and tone normal, muscle strength 5/5 throughout with no pronator drift.   Finger to nose testing intact.  Gait narrow-based and steady, able to tandem walk adequately.  Romberg negative.   IMPRESSION: This is a pleasant 75 yo RH woman with a history of hyperlipidemia, hypothyroidism s/p thyroidectomy, malignant melanoma s/p resection, with new onset seizure on 06/04/20 while on vacation in [place]. Her neurological exam is normal, no clear epilepsy risk factors. MRI brain  and 24-hour EEG normal. We discussed that after an initial seizure, unless there are significant risk factors, an abnormal neurological exam, an EEG showing epileptiform abnormalities, and/or abnormal neuroimaging, treatment with an antiepileptic drug is not indicated.  We discussed 10% of the population may have a single seizure. Patients with a single unprovoked seizure have a recurrence rate of 33% after a single seizure and 73% after a second seizure. She is interested in weaning down medication, we agreed to reduce Levetiracetam to 500mg  qhs. She will update our office in a month, and if no issues, plan to stop medication. We discussed risks for breakthrough seizure with any medication adjustment. [place] driving laws discussed, no driving until 6 months seizure-free. Follow-up in 3 months, they know to call for any changes. .    Thank you  for allowing me to participate in her care.  Please do not hesitate to call for any questions or concerns.    Ellouise Newer, M.D.   CC: Dr. Laurann Montana

## 2020-12-29 ENCOUNTER — Encounter: Payer: Self-pay | Admitting: Neurology

## 2021-01-05 DIAGNOSIS — C434 Malignant melanoma of scalp and neck: Secondary | ICD-10-CM | POA: Diagnosis not present

## 2021-01-05 DIAGNOSIS — M47812 Spondylosis without myelopathy or radiculopathy, cervical region: Secondary | ICD-10-CM | POA: Diagnosis not present

## 2021-01-05 DIAGNOSIS — K110 Atrophy of salivary gland: Secondary | ICD-10-CM | POA: Diagnosis not present

## 2021-01-05 DIAGNOSIS — E890 Postprocedural hypothyroidism: Secondary | ICD-10-CM | POA: Diagnosis not present

## 2021-01-15 DIAGNOSIS — E890 Postprocedural hypothyroidism: Secondary | ICD-10-CM | POA: Diagnosis not present

## 2021-01-15 DIAGNOSIS — I10 Essential (primary) hypertension: Secondary | ICD-10-CM | POA: Diagnosis not present

## 2021-01-15 DIAGNOSIS — I2510 Atherosclerotic heart disease of native coronary artery without angina pectoris: Secondary | ICD-10-CM | POA: Diagnosis not present

## 2021-01-15 DIAGNOSIS — E7800 Pure hypercholesterolemia, unspecified: Secondary | ICD-10-CM | POA: Diagnosis not present

## 2021-01-15 DIAGNOSIS — M169 Osteoarthritis of hip, unspecified: Secondary | ICD-10-CM | POA: Diagnosis not present

## 2021-01-15 DIAGNOSIS — D509 Iron deficiency anemia, unspecified: Secondary | ICD-10-CM | POA: Diagnosis not present

## 2021-01-15 DIAGNOSIS — M8580 Other specified disorders of bone density and structure, unspecified site: Secondary | ICD-10-CM | POA: Diagnosis not present

## 2021-02-07 DIAGNOSIS — L570 Actinic keratosis: Secondary | ICD-10-CM | POA: Diagnosis not present

## 2021-02-07 DIAGNOSIS — D485 Neoplasm of uncertain behavior of skin: Secondary | ICD-10-CM | POA: Diagnosis not present

## 2021-02-07 DIAGNOSIS — R569 Unspecified convulsions: Secondary | ICD-10-CM | POA: Diagnosis not present

## 2021-02-07 DIAGNOSIS — Z23 Encounter for immunization: Secondary | ICD-10-CM | POA: Diagnosis not present

## 2021-02-07 DIAGNOSIS — Z86018 Personal history of other benign neoplasm: Secondary | ICD-10-CM | POA: Diagnosis not present

## 2021-02-07 DIAGNOSIS — D225 Melanocytic nevi of trunk: Secondary | ICD-10-CM | POA: Diagnosis not present

## 2021-02-07 DIAGNOSIS — G479 Sleep disorder, unspecified: Secondary | ICD-10-CM | POA: Diagnosis not present

## 2021-02-07 DIAGNOSIS — K219 Gastro-esophageal reflux disease without esophagitis: Secondary | ICD-10-CM | POA: Diagnosis not present

## 2021-02-07 DIAGNOSIS — D2230 Melanocytic nevi of unspecified part of face: Secondary | ICD-10-CM | POA: Diagnosis not present

## 2021-02-07 DIAGNOSIS — L608 Other nail disorders: Secondary | ICD-10-CM | POA: Diagnosis not present

## 2021-02-07 DIAGNOSIS — D2372 Other benign neoplasm of skin of left lower limb, including hip: Secondary | ICD-10-CM | POA: Diagnosis not present

## 2021-02-07 DIAGNOSIS — L578 Other skin changes due to chronic exposure to nonionizing radiation: Secondary | ICD-10-CM | POA: Diagnosis not present

## 2021-02-07 DIAGNOSIS — Z85820 Personal history of malignant melanoma of skin: Secondary | ICD-10-CM | POA: Diagnosis not present

## 2021-02-07 DIAGNOSIS — D2271 Melanocytic nevi of right lower limb, including hip: Secondary | ICD-10-CM | POA: Diagnosis not present

## 2021-02-07 DIAGNOSIS — L821 Other seborrheic keratosis: Secondary | ICD-10-CM | POA: Diagnosis not present

## 2021-02-07 DIAGNOSIS — D2272 Melanocytic nevi of left lower limb, including hip: Secondary | ICD-10-CM | POA: Diagnosis not present

## 2021-02-16 DIAGNOSIS — Z85820 Personal history of malignant melanoma of skin: Secondary | ICD-10-CM | POA: Diagnosis not present

## 2021-02-16 DIAGNOSIS — C434 Malignant melanoma of scalp and neck: Secondary | ICD-10-CM | POA: Diagnosis not present

## 2021-02-16 DIAGNOSIS — Z08 Encounter for follow-up examination after completed treatment for malignant neoplasm: Secondary | ICD-10-CM | POA: Diagnosis not present

## 2021-02-27 DIAGNOSIS — Z1389 Encounter for screening for other disorder: Secondary | ICD-10-CM | POA: Diagnosis not present

## 2021-02-27 DIAGNOSIS — Z0000 Encounter for general adult medical examination without abnormal findings: Secondary | ICD-10-CM | POA: Diagnosis not present

## 2021-02-27 DIAGNOSIS — Z85820 Personal history of malignant melanoma of skin: Secondary | ICD-10-CM | POA: Diagnosis not present

## 2021-02-27 DIAGNOSIS — R1319 Other dysphagia: Secondary | ICD-10-CM | POA: Diagnosis not present

## 2021-02-27 DIAGNOSIS — E7800 Pure hypercholesterolemia, unspecified: Secondary | ICD-10-CM | POA: Diagnosis not present

## 2021-02-27 DIAGNOSIS — R059 Cough, unspecified: Secondary | ICD-10-CM | POA: Diagnosis not present

## 2021-02-27 DIAGNOSIS — R030 Elevated blood-pressure reading, without diagnosis of hypertension: Secondary | ICD-10-CM | POA: Diagnosis not present

## 2021-02-27 DIAGNOSIS — E890 Postprocedural hypothyroidism: Secondary | ICD-10-CM | POA: Diagnosis not present

## 2021-02-27 DIAGNOSIS — G40909 Epilepsy, unspecified, not intractable, without status epilepticus: Secondary | ICD-10-CM | POA: Diagnosis not present

## 2021-02-28 ENCOUNTER — Other Ambulatory Visit: Payer: Self-pay | Admitting: Internal Medicine

## 2021-02-28 DIAGNOSIS — R1319 Other dysphagia: Secondary | ICD-10-CM

## 2021-03-05 ENCOUNTER — Ambulatory Visit
Admission: RE | Admit: 2021-03-05 | Discharge: 2021-03-05 | Disposition: A | Discharge status: Home / Self Care | Payer: PPO | Source: Ambulatory Visit | Attending: Internal Medicine | Admitting: Internal Medicine

## 2021-03-05 DIAGNOSIS — R1310 Dysphagia, unspecified: Secondary | ICD-10-CM | POA: Diagnosis not present

## 2021-03-05 DIAGNOSIS — K224 Dyskinesia of esophagus: Secondary | ICD-10-CM | POA: Diagnosis not present

## 2021-03-05 DIAGNOSIS — R1319 Other dysphagia: Secondary | ICD-10-CM

## 2021-03-05 DIAGNOSIS — K449 Diaphragmatic hernia without obstruction or gangrene: Secondary | ICD-10-CM | POA: Diagnosis not present

## 2021-03-07 DIAGNOSIS — M65332 Trigger finger, left middle finger: Secondary | ICD-10-CM | POA: Diagnosis not present

## 2021-03-07 DIAGNOSIS — M65341 Trigger finger, right ring finger: Secondary | ICD-10-CM | POA: Diagnosis not present

## 2021-03-08 ENCOUNTER — Encounter: Payer: Self-pay | Admitting: Neurology

## 2021-03-08 ENCOUNTER — Ambulatory Visit: Payer: PPO | Admitting: Neurology

## 2021-03-08 ENCOUNTER — Other Ambulatory Visit: Payer: Self-pay

## 2021-03-08 VITALS — BP 149/86 | HR 74 | Ht 61.5 in | Wt 131.6 lb

## 2021-03-08 DIAGNOSIS — R569 Unspecified convulsions: Secondary | ICD-10-CM

## 2021-03-08 MED ORDER — LEVETIRACETAM 500 MG PO TABS: 500.0000 mg | ORAL_TABLET | Freq: Two times a day (BID) | ORAL | 3 refills | Status: DC

## 2021-03-08 NOTE — Patient Instructions (Addendum)
Good to see you doing well. Continue Keppra (Levetiracetam) 500mg : take 1 tablet twice a day. Follow-up in 6 months, call for any changes.   Seizure Precautions: 1. If medication has been prescribed for you to prevent seizures, take it exactly as directed.  Do not stop taking the medicine without talking to your doctor first, even if you have not had a seizure in a long time.   2. Avoid activities in which a seizure would cause danger to yourself or to others.  Don't operate dangerous machinery, swim alone, or climb in high or dangerous places, such as on ladders, roofs, or girders.  Do not drive unless your doctor says you may.  3. If you have any warning that you may have a seizure, lay down in a safe place where you can't hurt yourself.    4.  No driving for 6 months from last seizure, as per [facility]: Lambert Rhonda W.   Please refer to the following link on the [facility] website for more information: [email or web address]/[email or web address]   5.  Maintain good sleep hygiene. Avoid alcohol.  6.  Contact your doctor if you have any problems that may be related to the medicine you are taking.  7.  Call 911 and bring the patient back to the ED if:        A.  The seizure lasts longer than 5 minutes.       B.  The patient doesn't awaken shortly after the seizure  C.  The patient has new problems such as difficulty seeing, speaking or moving  D.  The patient was injured during the seizure  E.  The patient has a temperature over 102 F (39C)  F.  The patient vomited and now is having trouble breathing

## 2021-03-08 NOTE — Progress Notes (Signed)
NEUROLOGY FOLLOW UP OFFICE NOTE  Misty Woods [id number] 04-19-1945  HISTORY OF PRESENT ILLNESS: I had the pleasure of seeing Misty Woods in follow-up in the neurology clinic on 03/08/2021.  The patient was last seen 3 months ago for new onset seizure that occurred on 06/04/2020. She is again accompanied by her husband who helps supplement the history today.  Records and images were personally reviewed where available. MRI and 24-hour EEG were normal. She was started on Levetiracetam in the ER. She continued to report drowsiness despite switching to extended-release formulation, and expressed desire to wean off medication on last visit. Since then, she reports that she has been doing well, with no further drowsiness taking Levetiracetam 500mg  BID. She and her husband deny any seizures since 06/2020, no staring/unresponsive episodes, gaps in time, olfactory/gustatory hallucinations, focal numbness/tingling/weakness, myoclonic jerks. She has had an injection in the left hand for trigger finger. She had a barium swallow recently for esophageal dysmotility. She denies any headaches, dizziness, vision changes, no falls. Sleep and mood are good.    History on Initial Assessment 06/30/2020: This is a 76 year old right-handed woman with a history of hyperlipidemia, hypothyroidism s/p thyroidectomy, malignant melanoma s/p resection, with new onset seizure on 06/04/20 while on vacation in [place]. She was brought to [facility], records were reviewed. She reported having a significant headache and light sensitivity prior to the seizure and bit her tongue. MRI brain no acute changes. She was unable to do EEG. She was given Keppra and discharged home on Keppra 750mg  BID. She states that she woke up feeling fine, she recalls sitting by the pool then when walked by the kitchen, she had trouble seeing and light blinded her for a second. She does not recall sitting down then woke up  in the hospital with a bruise on her lower abdomen. Her husband notes she kept asking the same questions over and over. Today she denies having any headaches and states she very rarely gets headaches. She and her husband deny any staring/unresponsiveness, olfactory/gustatory hallucinations, deja vu, rising epigastric sensation, focal numbness/tingling/weakness, myoclonic jerks. She has had a chronic cough. She states the only medication change was a couple of months ago for her thyroid medication. She denies any alcohol intake, no sleep deprivation. She has occasional double vision. No dysarthria/dysphagia, neck/back pain, bowel/bladder dysfunction. When asked about memory, she states "I think I'm losing it." She denies missing medications. The Levetiracetam makes her a little dizzy, not quite as sleepy as before. She was previously weaned off Gabapentin but restarted it December 2021 for chronic cough. She had a normal birth and early development.  There is no history of febrile convulsions, CNS infections such as meningitis/encephalitis, significant traumatic brain injury, neurosurgical procedures, or family history of seizures.  PAST MEDICAL HISTORY: Past Medical History:  Diagnosis Date   Chronic cough    states "idiopathic"   Dental crowns present    x 2   High cholesterol    Hypothyroidism    Stenosing tenosynovitis of finger of right hand 12/2017   right third finger    MEDICATIONS: Current Outpatient Medications on File Prior to Visit  Medication Sig Dispense Refill   aspirin EC 81 MG tablet Take 81 mg by mouth daily.     Cholecalciferol (VITAMIN D3) 5000 units CAPS Take 1 capsule by mouth daily.     cyanocobalamin 100 MCG tablet Take by mouth.     gabapentin (NEURONTIN) 300 MG capsule TAKE  2 CAPSULES (=600MG )   TWICE DAILY (Patient taking differently: 2 (two) times daily.) 360 capsule 3   levETIRAcetam (KEPPRA) 500 MG tablet Take 1 tablet (500 mg total) by mouth 2 (two) times daily.  180 tablet 3   levothyroxine (SYNTHROID) 75 MCG tablet Take 100 mcg by mouth daily before breakfast.     nortriptyline (PAMELOR) 25 MG capsule Take 25 mg by mouth at bedtime.     rosuvastatin (CRESTOR) 20 MG tablet Take 1 tablet (20 mg total) by mouth daily. 90 tablet 3   No current facility-administered medications on file prior to visit.    ALLERGIES: Allergies  Allergen Reactions   Propofol Other (See Comments)    When propofol was injected into her vein during colonoscopy pt experienced severe burning feeling up entire arm, pt says she never wants propofol again   Tramadol Hcl Other (See Comments)    DIZZINESS   Hydrocodone     "loopy"   Ketorolac Other (See Comments)   Darvon [Propoxyphene] Rash   Tramadol Hcl Rash    REACTION: dizziness    FAMILY HISTORY: Family History  Problem Relation Age of Onset   Allergic rhinitis Brother    Asthma Brother     SOCIAL HISTORY: Social History   Socioeconomic History   Marital status: Married    Spouse name: Not on file   Number of children: Not on file   Years of education: Not on file   Highest education level: Not on file  Occupational History   Not on file  Tobacco Use   Smoking status: Former    Types: Cigarettes    Quit date: 02/03/1969    Years since quitting: 52.1   Smokeless tobacco: Never  Vaping Use   Vaping Use: Never used  Substance and Sexual Activity   Alcohol use: Not Currently    Comment: socially   Drug use: No   Sexual activity: Not on file  Other Topics Concern   Not on file  Social History Narrative   Right handed   Lives with family    Social Determinants of Health   Financial Resource Strain: Not on file  Food Insecurity: Not on file  Transportation Needs: Not on file  Physical Activity: Not on file  Stress: Not on file  Social Connections: Not on file  Intimate Partner Violence: Not on file     PHYSICAL EXAM: Vitals:   03/08/21 1112  BP: (!) 149/86  Pulse: 74  SpO2: 98%    General: No acute distress Head:  Normocephalic/atraumatic Skin/Extremities: No rash, no edema Neurological Exam: alert and awake. No aphasia or dysarthria. Fund of knowledge is appropriate. Attention and concentration are normal.   Cranial nerves: Pupils equal, round. Extraocular movements intact with no nystagmus. Visual fields full.  No facial asymmetry.  Motor: Bulk and tone normal, muscle strength 5/5 throughout with no pronator drift.   Finger to nose testing intact.  Gait narrow-based and steady, no ataxia   IMPRESSION: This is a pleasant 75 yo RH woman with a history of hyperlipidemia, hypothyroidism s/p thyroidectomy, malignant melanoma s/p resection, with new onset seizure on 06/04/20 while on vacation in [place]. MRI brain and 24-hour EEG normal. We discussed recurrence risk of seizure after 1 seizure in the setting of normal brain MRI and EEG, she opted to continue on Levetiracetam 500mg  BID as she is not having any side effects and would like to continue driving. We discussed avoidance of seizure triggers, including missing medication, sleep deprivation,  alcohol. She is aware of  driving laws to stop driving after a seizure until 6 months seizure-free. Follow-up in 6 months, call for any changes.   Thank you for allowing me to participate in her care.  Please do not hesitate to call for any questions or concerns.    Ellouise Newer, M.D.   CC: Dr. Laurann Montana

## 2021-04-16 DIAGNOSIS — M65332 Trigger finger, left middle finger: Secondary | ICD-10-CM | POA: Diagnosis not present

## 2021-05-01 DIAGNOSIS — Z86010 Personal history of colonic polyps: Secondary | ICD-10-CM | POA: Diagnosis not present

## 2021-05-01 DIAGNOSIS — Z8719 Personal history of other diseases of the digestive system: Secondary | ICD-10-CM | POA: Diagnosis not present

## 2021-05-01 DIAGNOSIS — K219 Gastro-esophageal reflux disease without esophagitis: Secondary | ICD-10-CM | POA: Diagnosis not present

## 2021-05-01 DIAGNOSIS — K449 Diaphragmatic hernia without obstruction or gangrene: Secondary | ICD-10-CM | POA: Diagnosis not present

## 2021-08-03 DIAGNOSIS — R918 Other nonspecific abnormal finding of lung field: Secondary | ICD-10-CM | POA: Diagnosis not present

## 2021-08-03 DIAGNOSIS — C434 Malignant melanoma of scalp and neck: Secondary | ICD-10-CM | POA: Diagnosis not present

## 2021-08-08 DIAGNOSIS — Z1231 Encounter for screening mammogram for malignant neoplasm of breast: Secondary | ICD-10-CM | POA: Diagnosis not present

## 2021-08-14 DIAGNOSIS — D2271 Melanocytic nevi of right lower limb, including hip: Secondary | ICD-10-CM | POA: Diagnosis not present

## 2021-08-14 DIAGNOSIS — L720 Epidermal cyst: Secondary | ICD-10-CM | POA: Diagnosis not present

## 2021-08-14 DIAGNOSIS — D2272 Melanocytic nevi of left lower limb, including hip: Secondary | ICD-10-CM | POA: Diagnosis not present

## 2021-08-14 DIAGNOSIS — Z85820 Personal history of malignant melanoma of skin: Secondary | ICD-10-CM | POA: Diagnosis not present

## 2021-08-14 DIAGNOSIS — L821 Other seborrheic keratosis: Secondary | ICD-10-CM | POA: Diagnosis not present

## 2021-08-14 DIAGNOSIS — L578 Other skin changes due to chronic exposure to nonionizing radiation: Secondary | ICD-10-CM | POA: Diagnosis not present

## 2021-08-14 DIAGNOSIS — D2372 Other benign neoplasm of skin of left lower limb, including hip: Secondary | ICD-10-CM | POA: Diagnosis not present

## 2021-08-14 DIAGNOSIS — Z85828 Personal history of other malignant neoplasm of skin: Secondary | ICD-10-CM | POA: Diagnosis not present

## 2021-08-14 DIAGNOSIS — D485 Neoplasm of uncertain behavior of skin: Secondary | ICD-10-CM | POA: Diagnosis not present

## 2021-08-14 DIAGNOSIS — D225 Melanocytic nevi of trunk: Secondary | ICD-10-CM | POA: Diagnosis not present

## 2021-08-14 DIAGNOSIS — Z86018 Personal history of other benign neoplasm: Secondary | ICD-10-CM | POA: Diagnosis not present

## 2021-08-14 DIAGNOSIS — D2230 Melanocytic nevi of unspecified part of face: Secondary | ICD-10-CM | POA: Diagnosis not present

## 2021-09-05 DIAGNOSIS — C434 Malignant melanoma of scalp and neck: Secondary | ICD-10-CM | POA: Diagnosis not present

## 2021-09-05 DIAGNOSIS — L910 Hypertrophic scar: Secondary | ICD-10-CM | POA: Diagnosis not present

## 2021-09-06 ENCOUNTER — Encounter: Payer: Self-pay | Admitting: Neurology

## 2021-09-06 ENCOUNTER — Ambulatory Visit: Payer: PPO | Admitting: Neurology

## 2021-09-06 VITALS — BP 154/85 | HR 72 | Ht 61.5 in | Wt 126.0 lb

## 2021-09-06 DIAGNOSIS — R569 Unspecified convulsions: Secondary | ICD-10-CM

## 2021-09-06 MED ORDER — LEVETIRACETAM 500 MG PO TABS: 500.0000 mg | ORAL_TABLET | Freq: Two times a day (BID) | ORAL | 3 refills | Status: DC

## 2021-09-06 NOTE — Progress Notes (Signed)
NEUROLOGY FOLLOW UP OFFICE NOTE  Misty Woods [id number] 03-Jul-1945  HISTORY OF PRESENT ILLNESS: I had the pleasure of seeing Misty Woods in follow-up in the neurology clinic on 09/06/2021.  The patient was last seen 6 months ago. She had a new onset seizure on 06/04/20, etiology unknown. MRI brain and 24-hour EEG normal. She was started on Levetiracetam '500mg'$  BID in the ER and has opted to continue on medication. She and her husband deny any seizures or seizure-like symptoms since 06/2020. She denies any staring/unresponsive episodes, gaps in time, olfactory/gustatory hallucinations, focal numbness/tingling/weakness, myoclonic jerks. No headaches, dizziness, vision changes, no falls. Sleep and mood are good. No side effects on medication. She is concerned about dry mouth and a possible lymph node on the right side of her neck. She denies any sore throat or upper respiratory symptoms. She is on gabapentin and nortriptyline for chronic cough.   History on Initial Assessment 06/30/2020: This is a 76 year old right-handed woman with a history of hyperlipidemia, hypothyroidism s/p thyroidectomy, malignant melanoma s/p resection, with new onset seizure on 06/04/20 while on vacation in [place]. She was brought to [facility], records were reviewed. She reported having a significant headache and light sensitivity prior to the seizure and bit her tongue. MRI brain no acute changes. She was unable to do EEG. She was given Keppra and discharged home on Keppra '750mg'$  BID. She states that she woke up feeling fine, she recalls sitting by the pool then when walked by the kitchen, she had trouble seeing and light blinded her for a second. She does not recall sitting down then woke up in the hospital with a bruise on her lower abdomen. Her husband notes she kept asking the same questions over and over. Today she denies having any headaches and states she very rarely gets headaches. She  and her husband deny any staring/unresponsiveness, olfactory/gustatory hallucinations, deja vu, rising epigastric sensation, focal numbness/tingling/weakness, myoclonic jerks. She has had a chronic cough. She states the only medication change was a couple of months ago for her thyroid medication. She denies any alcohol intake, no sleep deprivation. She has occasional double vision. No dysarthria/dysphagia, neck/back pain, bowel/bladder dysfunction. When asked about memory, she states "I think I'm losing it." She denies missing medications. The Levetiracetam makes her a little dizzy, not quite as sleepy as before. She was previously weaned off Gabapentin but restarted it December 2021 for chronic cough. She had a normal birth and early development.  There is no history of febrile convulsions, CNS infections such as meningitis/encephalitis, significant traumatic brain injury, neurosurgical procedures, or family history of seizures.    PAST MEDICAL HISTORY: Past Medical History:  Diagnosis Date   Chronic cough    states "idiopathic"   Dental crowns present    x 2   High cholesterol    Hypothyroidism    Stenosing tenosynovitis of finger of right hand 12/2017   right third finger    MEDICATIONS: Current Outpatient Medications on File Prior to Visit  Medication Sig Dispense Refill   aspirin EC 81 MG tablet Take 81 mg by mouth daily.     Cholecalciferol (VITAMIN D3) 5000 units CAPS Take 1 capsule by mouth daily.     cyanocobalamin 100 MCG tablet Take by mouth.     gabapentin (NEURONTIN) 300 MG capsule TAKE 2 CAPSULES (='600MG'$ )   TWICE DAILY (Patient taking differently: 2 (two) times daily.) 360 capsule 3   levETIRAcetam (KEPPRA) 500 MG tablet Take  1 tablet (500 mg total) by mouth 2 (two) times daily. 180 tablet 3   levothyroxine (SYNTHROID) 75 MCG tablet Take 100 mcg by mouth daily before breakfast.     nortriptyline (PAMELOR) 25 MG capsule Take 25 mg by mouth at bedtime.     rosuvastatin  (CRESTOR) 20 MG tablet Take 1 tablet (20 mg total) by mouth daily. 90 tablet 3   No current facility-administered medications on file prior to visit.    ALLERGIES: Allergies  Allergen Reactions   Propofol Other (See Comments)    When propofol was injected into her vein during colonoscopy pt experienced severe burning feeling up entire arm, pt says she never wants propofol again   Tramadol Hcl Other (See Comments)    DIZZINESS   Hydrocodone     "loopy"   Ketorolac Other (See Comments)   Darvon [Propoxyphene] Rash   Tramadol Hcl Rash    REACTION: dizziness    FAMILY HISTORY: Family History  Problem Relation Age of Onset   Allergic rhinitis Brother    Asthma Brother     SOCIAL HISTORY: Social History   Socioeconomic History   Marital status: Married    Spouse name: Not on file   Number of children: Not on file   Years of education: Not on file   Highest education level: Not on file  Occupational History   Not on file  Tobacco Use   Smoking status: Former    Types: Cigarettes    Quit date: 02/03/1969    Years since quitting: 52.6   Smokeless tobacco: Never  Vaping Use   Vaping Use: Never used  Substance and Sexual Activity   Alcohol use: Not Currently    Comment: socially   Drug use: No   Sexual activity: Not on file  Other Topics Concern   Not on file  Social History Narrative   Right handed   Lives with family    Social Determinants of Health   Financial Resource Strain: Not on file  Food Insecurity: Not on file  Transportation Needs: Not on file  Physical Activity: Not on file  Stress: Not on file  Social Connections: Not on file  Intimate Partner Violence: Not on file     PHYSICAL EXAM: Vitals:   09/06/21 1124  BP: (!) 154/85  Pulse: 72  SpO2: 97%   General: No acute distress Head:  Normocephalic/atraumatic, there appears to be lymphadenopathy on the right neck region, non-fixed Skin/Extremities: No rash, no edema Neurological Exam: alert  and awake. No aphasia or dysarthria. Fund of knowledge is appropriate. Attention and concentration are normal.   Cranial nerves: Pupils equal, round. Extraocular movements intact with no nystagmus. Visual fields full.  No facial asymmetry.  Motor: Bulk and tone normal, muscle strength 5/5 throughout with no pronator drift.   Finger to nose testing intact.  Gait narrow-based and steady, able to tandem walk adequately.  Romberg negative.   IMPRESSION: This is a pleasant 76 yo RH woman with a history of hyperlipidemia, hypothyroidism s/p thyroidectomy, malignant melanoma s/p resection, with new onset seizure on 06/04/20 while on vacation in [place]. MRI brain and 24-hour EEG normal. We had discussed typical prognosis after 1 seizure in the setting of normal MRI and EEG. She has opted to continue on Levetiracetam '500mg'$  BID. We can re-assess in a year (when she is 2 years seizure-free). She will discuss possible lymph node in neck and dry mouth with PCP. She is aware of [place] driving laws to stop  driving after a seizure until 6 months seizure-free. Follow-up in 1 year, call for any changes.    Thank you for allowing me to participate in her care.  Please do not hesitate to call for any questions or concerns.    Ellouise Newer, M.D.   CC: Dr. Laurann Montana
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
## 2021-09-14 DIAGNOSIS — I2510 Atherosclerotic heart disease of native coronary artery without angina pectoris: Secondary | ICD-10-CM | POA: Diagnosis not present

## 2021-09-14 DIAGNOSIS — Z87891 Personal history of nicotine dependence: Secondary | ICD-10-CM | POA: Diagnosis not present

## 2021-09-14 DIAGNOSIS — L905 Scar conditions and fibrosis of skin: Secondary | ICD-10-CM | POA: Diagnosis not present

## 2021-09-14 DIAGNOSIS — L910 Hypertrophic scar: Secondary | ICD-10-CM | POA: Diagnosis not present

## 2021-09-14 DIAGNOSIS — Z85820 Personal history of malignant melanoma of skin: Secondary | ICD-10-CM | POA: Diagnosis not present

## 2021-09-28 DIAGNOSIS — Z9889 Other specified postprocedural states: Secondary | ICD-10-CM | POA: Diagnosis not present

## 2021-09-28 DIAGNOSIS — C434 Malignant melanoma of scalp and neck: Secondary | ICD-10-CM | POA: Diagnosis not present

## 2021-09-28 DIAGNOSIS — C439 Malignant melanoma of skin, unspecified: Secondary | ICD-10-CM | POA: Diagnosis not present

## 2021-09-28 DIAGNOSIS — R911 Solitary pulmonary nodule: Secondary | ICD-10-CM | POA: Diagnosis not present

## 2021-10-04 DIAGNOSIS — C439 Malignant melanoma of skin, unspecified: Secondary | ICD-10-CM | POA: Diagnosis not present

## 2021-10-04 DIAGNOSIS — Z87891 Personal history of nicotine dependence: Secondary | ICD-10-CM | POA: Diagnosis not present

## 2021-10-04 DIAGNOSIS — R911 Solitary pulmonary nodule: Secondary | ICD-10-CM | POA: Diagnosis not present

## 2021-10-04 DIAGNOSIS — Z85820 Personal history of malignant melanoma of skin: Secondary | ICD-10-CM | POA: Diagnosis not present

## 2021-10-05 DIAGNOSIS — K529 Noninfective gastroenteritis and colitis, unspecified: Secondary | ICD-10-CM | POA: Diagnosis not present

## 2021-10-26 DIAGNOSIS — C434 Malignant melanoma of scalp and neck: Secondary | ICD-10-CM | POA: Diagnosis not present

## 2021-10-26 DIAGNOSIS — R053 Chronic cough: Secondary | ICD-10-CM | POA: Diagnosis not present

## 2021-10-26 DIAGNOSIS — R911 Solitary pulmonary nodule: Secondary | ICD-10-CM | POA: Diagnosis not present

## 2021-10-31 DIAGNOSIS — C3411 Malignant neoplasm of upper lobe, right bronchus or lung: Secondary | ICD-10-CM | POA: Diagnosis not present

## 2021-10-31 DIAGNOSIS — Z79899 Other long term (current) drug therapy: Secondary | ICD-10-CM | POA: Diagnosis not present

## 2021-10-31 DIAGNOSIS — R911 Solitary pulmonary nodule: Secondary | ICD-10-CM | POA: Diagnosis not present

## 2021-10-31 DIAGNOSIS — R569 Unspecified convulsions: Secondary | ICD-10-CM | POA: Diagnosis not present

## 2021-10-31 DIAGNOSIS — R918 Other nonspecific abnormal finding of lung field: Secondary | ICD-10-CM | POA: Diagnosis not present

## 2021-11-07 DIAGNOSIS — C792 Secondary malignant neoplasm of skin: Secondary | ICD-10-CM | POA: Diagnosis not present

## 2021-11-07 DIAGNOSIS — C7801 Secondary malignant neoplasm of right lung: Secondary | ICD-10-CM | POA: Diagnosis not present

## 2021-11-07 DIAGNOSIS — C434 Malignant melanoma of scalp and neck: Secondary | ICD-10-CM | POA: Diagnosis not present

## 2021-11-07 DIAGNOSIS — R053 Chronic cough: Secondary | ICD-10-CM | POA: Diagnosis not present

## 2021-11-09 DIAGNOSIS — C434 Malignant melanoma of scalp and neck: Secondary | ICD-10-CM | POA: Diagnosis not present

## 2021-11-09 DIAGNOSIS — R911 Solitary pulmonary nodule: Secondary | ICD-10-CM | POA: Diagnosis not present

## 2021-12-04 DIAGNOSIS — C7801 Secondary malignant neoplasm of right lung: Secondary | ICD-10-CM | POA: Diagnosis not present

## 2021-12-04 DIAGNOSIS — C7802 Secondary malignant neoplasm of left lung: Secondary | ICD-10-CM | POA: Diagnosis not present

## 2021-12-04 DIAGNOSIS — G9389 Other specified disorders of brain: Secondary | ICD-10-CM | POA: Diagnosis not present

## 2021-12-04 DIAGNOSIS — C787 Secondary malignant neoplasm of liver and intrahepatic bile duct: Secondary | ICD-10-CM | POA: Diagnosis not present

## 2021-12-04 DIAGNOSIS — C434 Malignant melanoma of scalp and neck: Secondary | ICD-10-CM | POA: Diagnosis not present

## 2021-12-04 DIAGNOSIS — R599 Enlarged lymph nodes, unspecified: Secondary | ICD-10-CM | POA: Diagnosis not present

## 2021-12-04 DIAGNOSIS — C7951 Secondary malignant neoplasm of bone: Secondary | ICD-10-CM | POA: Diagnosis not present

## 2021-12-04 DIAGNOSIS — C7989 Secondary malignant neoplasm of other specified sites: Secondary | ICD-10-CM | POA: Diagnosis not present

## 2021-12-04 DIAGNOSIS — M899 Disorder of bone, unspecified: Secondary | ICD-10-CM | POA: Diagnosis not present

## 2021-12-05 DIAGNOSIS — C7801 Secondary malignant neoplasm of right lung: Secondary | ICD-10-CM | POA: Diagnosis not present

## 2021-12-05 DIAGNOSIS — C787 Secondary malignant neoplasm of liver and intrahepatic bile duct: Secondary | ICD-10-CM | POA: Diagnosis not present

## 2021-12-05 DIAGNOSIS — C7951 Secondary malignant neoplasm of bone: Secondary | ICD-10-CM | POA: Diagnosis not present

## 2021-12-05 DIAGNOSIS — R053 Chronic cough: Secondary | ICD-10-CM | POA: Diagnosis not present

## 2021-12-05 DIAGNOSIS — Z5112 Encounter for antineoplastic immunotherapy: Secondary | ICD-10-CM | POA: Diagnosis not present

## 2021-12-05 DIAGNOSIS — Z79899 Other long term (current) drug therapy: Secondary | ICD-10-CM | POA: Diagnosis not present

## 2021-12-05 DIAGNOSIS — C434 Malignant melanoma of scalp and neck: Secondary | ICD-10-CM | POA: Diagnosis not present

## 2021-12-05 DIAGNOSIS — C439 Malignant melanoma of skin, unspecified: Secondary | ICD-10-CM | POA: Diagnosis not present

## 2021-12-05 DIAGNOSIS — C7800 Secondary malignant neoplasm of unspecified lung: Secondary | ICD-10-CM | POA: Diagnosis not present

## 2021-12-09 ENCOUNTER — Emergency Department (HOSPITAL_BASED_OUTPATIENT_CLINIC_OR_DEPARTMENT_OTHER): Payer: PPO

## 2021-12-09 ENCOUNTER — Encounter (HOSPITAL_BASED_OUTPATIENT_CLINIC_OR_DEPARTMENT_OTHER): Payer: Self-pay

## 2021-12-09 DIAGNOSIS — Z7982 Long term (current) use of aspirin: Secondary | ICD-10-CM | POA: Diagnosis not present

## 2021-12-09 DIAGNOSIS — Z79899 Other long term (current) drug therapy: Secondary | ICD-10-CM | POA: Insufficient documentation

## 2021-12-09 DIAGNOSIS — M79604 Pain in right leg: Secondary | ICD-10-CM | POA: Diagnosis not present

## 2021-12-09 DIAGNOSIS — Z85828 Personal history of other malignant neoplasm of skin: Secondary | ICD-10-CM | POA: Insufficient documentation

## 2021-12-09 DIAGNOSIS — E039 Hypothyroidism, unspecified: Secondary | ICD-10-CM | POA: Insufficient documentation

## 2021-12-09 DIAGNOSIS — M79661 Pain in right lower leg: Secondary | ICD-10-CM | POA: Diagnosis not present

## 2021-12-09 NOTE — ED Triage Notes (Signed)
Pt states she had rt. Leg pain right behind knee. Started a few days ago.  Dx with melanoma 2020 and is receiving immunotherapy Contacted oncologist and he advised pt to be evaluated for possible blood clot

## 2021-12-10 ENCOUNTER — Emergency Department (HOSPITAL_BASED_OUTPATIENT_CLINIC_OR_DEPARTMENT_OTHER)
Admission: EM | Admit: 2021-12-10 | Discharge: 2021-12-10 | Disposition: A | Discharge status: Home / Self Care | Payer: PPO | Attending: Emergency Medicine | Admitting: Emergency Medicine

## 2021-12-10 DIAGNOSIS — M79604 Pain in right leg: Secondary | ICD-10-CM

## 2021-12-10 MED ORDER — KETOROLAC TROMETHAMINE 30 MG/ML IJ SOLN
30.0000 mg | Freq: Once | INTRAMUSCULAR | Status: AC
  Administered 2021-12-10: 30 mg via INTRAMUSCULAR

## 2021-12-10 NOTE — ED Notes (Signed)
Patient reports her allergy to toradol is dizziness.  Patient reports she still is ok to take toradol. MD Cardama made aware.

## 2021-12-10 NOTE — ED Provider Notes (Signed)
[place] EMERGENCY DEPT Provider Note  CSN: [id number] Arrival date & time: 12/09/21 1934  Chief Complaint(s) right leg pain  HPI Misty Woods is a 76 y.o. female with a past medical history listed below including metastatic melanoma currently on immunosuppressive therapy here for several days of right leg aching pain.  Moderate in intensity.  No alleviating or aggravating factors.  She denies any fall or trauma.  Reports [place] travel to the beach.  No swelling.  No prior history of DVT/PE.  Recent CT PET study did not show any evidence of metastases to the right leg or lumbar spine.  Patient reports that she spoke with her doctor who recommended getting ruled out for blood clot.  HPI  Past Medical History Past Medical History:  Diagnosis Date   Chronic cough    states "idiopathic"   Dental crowns present    x 2   High cholesterol    Hypothyroidism    Melanoma ([place]) 2020   Stenosing tenosynovitis of finger of right hand 12/2017   right third finger   Patient Active Problem List   Diagnosis Date Noted   Coronary artery calcification seen on CAT scan 03/11/2017   Hyperlipidemia LDL goal <70 03/11/2017   Chronic rhinitis 06/21/2016   GOITER, UNSPECIFIED 07/09/2007   TRANSIENT GLOBAL AMNESIA 07/09/2007   GASTROESOPHAGEAL REFLUX DISEASE, MILD 07/09/2007   DEGENERATIVE JOINT DISEASE, MILD 07/09/2007   VERTIGO 07/09/2007   Cough 07/09/2007   Home Medication(s) Prior to Admission medications   Medication Sig Start Date End Date Taking? Authorizing Provider  aspirin EC 81 MG tablet Take 81 mg by mouth daily.    [provider]  Cholecalciferol (VITAMIN D3) 5000 units CAPS Take 1 capsule by mouth daily.    [provider]  cyanocobalamin 100 MCG tablet Take by mouth.    [provider]  gabapentin (NEURONTIN) 300 MG capsule TAKE 2 CAPSULES (='600MG'$ )   TWICE DAILY Patient taking differently: 2 (two) times daily. 11/23/18    Mannam, Hart Robinsons, MD  levETIRAcetam (KEPPRA) 500 MG tablet Take 1 tablet (500 mg total) by mouth 2 (two) times daily. 09/06/21   Cameron Sprang, MD  levothyroxine (SYNTHROID) 75 MCG tablet Take 75 mcg by mouth daily before breakfast.    [provider]  nortriptyline (PAMELOR) 25 MG capsule Take 25 mg by mouth at bedtime. 06/14/20   [provider]  rosuvastatin (CRESTOR) 20 MG tablet Take 1 tablet (20 mg total) by mouth daily. 04/23/17 09/06/21  Belva Crome, MD                                                                                                                                    Allergies Propofol, Tramadol hcl, Hydrocodone, Ketorolac, Darvon [propoxyphene], and Tramadol hcl  Review of Systems Review of Systems As noted in HPI  Physical Exam Vital Signs  I have reviewed the triage vital signs BP Marland Kitchen)  173/91   Pulse 71   Temp 98 F (36.7 C) (Oral)   Resp 18   Ht '5\' 1"'$  (1.549 m)   Wt 54.4 kg   SpO2 96%   BMI 22.67 kg/m   Physical Exam Vitals reviewed.  Constitutional:      General: She is not in acute distress.    Appearance: She is well-developed. She is not diaphoretic.  HENT:     Head: Normocephalic and atraumatic.     Right Ear: External ear normal.     Left Ear: External ear normal.     Nose: Nose normal.  Eyes:     General: No scleral icterus.    Conjunctiva/sclera: Conjunctivae normal.  Neck:     Trachea: Phonation normal.  Cardiovascular:     Rate and Rhythm: Normal rate and regular rhythm.  Pulmonary:     Effort: Pulmonary effort is normal. No respiratory distress.     Breath sounds: No stridor.  Abdominal:     General: There is no distension.  Musculoskeletal:        General: Normal range of motion.     Cervical back: Normal range of motion.     Right lower leg: Tenderness present. No swelling, deformity or bony tenderness. No edema.     Left lower leg: No swelling, deformity, tenderness or bony tenderness. No edema.        Legs:  Neurological:     Mental Status: She is alert and oriented to person, place, and time.  Psychiatric:        Behavior: Behavior normal.     ED Results and Treatments Labs (all labs ordered are listed, but only abnormal results are displayed) Labs Reviewed - No data to display                                                                                                                       EKG  EKG Interpretation  Date/Time:    Ventricular Rate:    PR Interval:    QRS Duration:   QT Interval:    QTC Calculation:   R Axis:     Text Interpretation:         Radiology US Venous Img Lower Unilateral Right (DVT)  Result Date: 12/09/2021 CLINICAL DATA:  Pain EXAM: Right LOWER EXTREMITY VENOUS DOPPLER ULTRASOUND TECHNIQUE: Gray-scale sonography with compression, as well as color and duplex ultrasound, were performed to evaluate the deep venous system(s) from the level of the common femoral vein through the popliteal and proximal calf veins. COMPARISON:  None Available. FINDINGS: VENOUS Normal compressibility of the common femoral, superficial femoral, and popliteal veins, as well as the visualized calf veins. Visualized portions of profunda femoral vein and great saphenous vein unremarkable. No filling defects to suggest DVT on grayscale or color Doppler imaging. Doppler waveforms show normal direction of venous flow, normal respiratory plasticity and response to augmentation. Limited views of the contralateral common femoral vein are unremarkable. OTHER None. Limitations: none IMPRESSION: There  is no evidence of deep venous thrombosis in right lower extremity. Electronically Signed   By: Elmer Picker M.D.   On: 12/09/2021 20:46    Medications Ordered in ED Medications  ketorolac (TORADOL) 30 MG/ML injection 30 mg (30 mg Intramuscular Given 12/10/21 0220)                                                                                                                                      Procedures Procedures  (including critical care time)  Medical Decision Making / ED Course   Medical Decision Making Amount and/or Complexity of Data Reviewed Radiology: ordered and independent interpretation performed. Decision-making details documented in ED Course.  Risk Prescription drug management.     Complexity of Problem:  Co-morbidities/SDOH that complicate the patient evaluation/care: Noted above in HPI  Additional history obtained: Confirm CT PET scan without lesions to the right leg.  Patient's presenting problem/concern, DDX, and MDM listed below: Right leg pain Given history of active cancer we will rule out DVT.  Given lack of trauma doubt fracture/dislocation or occult fracture.  Given her lack of lesions there.  No need for x-ray but will get ultrasound. No evidence of cellulitis or infection.    Complexity of Data:   Imaging Studies ordered listed below with my independent interpretation: Ultrasound negative for DVT.     ED Course:    Assessment, Add'l Intervention, and Reassessment: Right leg pain Ruled out for DVT Likely musculoskeletal in nature. Supportive management recommended PCP follow-up       Final Clinical Impression(s) / ED Diagnoses Final diagnoses:  Right leg pain   The patient appears reasonably screened and/or stabilized for discharge and I doubt any other medical condition or other [facility] requiring further screening, evaluation, or treatment in the ED at this time. I have discussed the findings, Dx and Tx plan with the patient/family who expressed understanding and agree(s) with the plan. Discharge instructions discussed at length. The patient/family was given strict return precautions who verbalized understanding of the instructions. No further questions at time of discharge.  Disposition: Discharge  Condition: Good  ED Discharge Orders     None        Follow Up: Lavone Orn, MD [place] [address] [employer] [address] [place] [address] [phone]  Call  to schedule an appointment for close follow up           This chart was dictated using voice recognition software.  Despite best efforts to proofread,  errors can occur which can change the documentation meaning.    Fatima Blank, MD 12/10/21 [phone]

## 2021-12-11 DIAGNOSIS — I10 Essential (primary) hypertension: Secondary | ICD-10-CM | POA: Diagnosis not present

## 2021-12-11 DIAGNOSIS — E7800 Pure hypercholesterolemia, unspecified: Secondary | ICD-10-CM | POA: Diagnosis not present

## 2021-12-11 DIAGNOSIS — M8580 Other specified disorders of bone density and structure, unspecified site: Secondary | ICD-10-CM | POA: Diagnosis not present

## 2021-12-11 DIAGNOSIS — I2510 Atherosclerotic heart disease of native coronary artery without angina pectoris: Secondary | ICD-10-CM | POA: Diagnosis not present

## 2021-12-11 DIAGNOSIS — E039 Hypothyroidism, unspecified: Secondary | ICD-10-CM | POA: Diagnosis not present

## 2021-12-19 DIAGNOSIS — C434 Malignant melanoma of scalp and neck: Secondary | ICD-10-CM | POA: Diagnosis not present

## 2021-12-19 DIAGNOSIS — L910 Hypertrophic scar: Secondary | ICD-10-CM | POA: Diagnosis not present

## 2022-01-02 DIAGNOSIS — Z801 Family history of malignant neoplasm of trachea, bronchus and lung: Secondary | ICD-10-CM | POA: Diagnosis not present

## 2022-01-02 DIAGNOSIS — C7800 Secondary malignant neoplasm of unspecified lung: Secondary | ICD-10-CM | POA: Diagnosis not present

## 2022-01-02 DIAGNOSIS — C7951 Secondary malignant neoplasm of bone: Secondary | ICD-10-CM | POA: Diagnosis not present

## 2022-01-02 DIAGNOSIS — E890 Postprocedural hypothyroidism: Secondary | ICD-10-CM | POA: Diagnosis not present

## 2022-01-02 DIAGNOSIS — C434 Malignant melanoma of scalp and neck: Secondary | ICD-10-CM | POA: Diagnosis not present

## 2022-01-02 DIAGNOSIS — Z79899 Other long term (current) drug therapy: Secondary | ICD-10-CM | POA: Diagnosis not present

## 2022-01-02 DIAGNOSIS — C787 Secondary malignant neoplasm of liver and intrahepatic bile duct: Secondary | ICD-10-CM | POA: Diagnosis not present

## 2022-01-02 DIAGNOSIS — C7801 Secondary malignant neoplasm of right lung: Secondary | ICD-10-CM | POA: Diagnosis not present

## 2022-01-02 DIAGNOSIS — Z79890 Hormone replacement therapy (postmenopausal): Secondary | ICD-10-CM | POA: Diagnosis not present

## 2022-01-02 DIAGNOSIS — Z5112 Encounter for antineoplastic immunotherapy: Secondary | ICD-10-CM | POA: Diagnosis not present

## 2022-01-02 DIAGNOSIS — C7989 Secondary malignant neoplasm of other specified sites: Secondary | ICD-10-CM | POA: Diagnosis not present

## 2022-01-02 DIAGNOSIS — R053 Chronic cough: Secondary | ICD-10-CM | POA: Diagnosis not present

## 2022-01-02 DIAGNOSIS — G893 Neoplasm related pain (acute) (chronic): Secondary | ICD-10-CM | POA: Diagnosis not present

## 2022-01-15 ENCOUNTER — Emergency Department (HOSPITAL_COMMUNITY)
Admission: EM | Admit: 2022-01-15 | Discharge: 2022-01-15 | Disposition: A | Discharge status: Home / Self Care | Payer: PPO | Attending: Emergency Medicine | Admitting: Emergency Medicine

## 2022-01-15 ENCOUNTER — Other Ambulatory Visit: Payer: Self-pay

## 2022-01-15 ENCOUNTER — Emergency Department (HOSPITAL_COMMUNITY): Payer: PPO

## 2022-01-15 DIAGNOSIS — R400 Somnolence: Secondary | ICD-10-CM | POA: Diagnosis not present

## 2022-01-15 DIAGNOSIS — Y92410 Unspecified street and highway as the place of occurrence of the external cause: Secondary | ICD-10-CM | POA: Diagnosis not present

## 2022-01-15 DIAGNOSIS — Z7982 Long term (current) use of aspirin: Secondary | ICD-10-CM | POA: Insufficient documentation

## 2022-01-15 DIAGNOSIS — Z85820 Personal history of malignant melanoma of skin: Secondary | ICD-10-CM | POA: Diagnosis not present

## 2022-01-15 DIAGNOSIS — R55 Syncope and collapse: Secondary | ICD-10-CM | POA: Insufficient documentation

## 2022-01-15 LAB — BASIC METABOLIC PANEL
Anion gap: 9 (ref 5–15)
BUN: 14 mg/dL (ref 8–23)
CO2: 29 mmol/L (ref 22–32)
Calcium: 9.9 mg/dL (ref 8.9–10.3)
Chloride: 102 mmol/L (ref 98–111)
Creatinine, Ser: 0.73 mg/dL (ref 0.44–1.00)
GFR, Estimated: >60 mL/min (ref >60)
Glucose, Bld: 98 mg/dL (ref 70–99)
Potassium: 3.8 mmol/L (ref 3.5–5.1)
Sodium: 140 mmol/L (ref 135–145)

## 2022-01-15 LAB — CBC
HCT: 38.9 % (ref 36.0–46.0)
Hemoglobin: 12.4 g/dL (ref 12.0–15.0)
MCH: 32.1 pg (ref 26.0–34.0)
MCHC: 31.9 g/dL (ref 30.0–36.0)
MCV: 100.8 fL — ABNORMAL HIGH (ref 80.0–100.0)
Platelets: 233 10*3/uL (ref 150–400)
RBC: 3.86 MIL/uL — ABNORMAL LOW (ref 3.87–5.11)
RDW: 14.6 % (ref 11.5–15.5)
WBC: 10.4 10*3/uL (ref 4.0–10.5)
nRBC: 0 % (ref 0.0–0.2)

## 2022-01-15 LAB — PROTIME-INR
INR: 1.1 (ref 0.8–1.2)
Prothrombin Time: 13.9 seconds (ref 11.4–15.2)

## 2022-01-15 LAB — APTT: aPTT: 32 seconds (ref 24–36)
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
## 2022-01-15 NOTE — Discharge Instructions (Addendum)
Please follow-up with your primary care doctor if you start having severe headaches, chest pain, shortness of breath please return to the ER.
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
## 2022-01-15 NOTE — ED Notes (Signed)
Signature pad not working at time of d/c. Pt verbalized understanding of verbal and written d/c instructions
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
## 2022-01-25 DIAGNOSIS — Z87828 Personal history of other (healed) physical injury and trauma: Secondary | ICD-10-CM | POA: Diagnosis not present

## 2022-01-25 DIAGNOSIS — R404 Transient alteration of awareness: Secondary | ICD-10-CM | POA: Diagnosis not present

## 2022-01-30 DIAGNOSIS — Z9089 Acquired absence of other organs: Secondary | ICD-10-CM | POA: Diagnosis not present

## 2022-01-30 DIAGNOSIS — C499 Malignant neoplasm of connective and soft tissue, unspecified: Secondary | ICD-10-CM | POA: Diagnosis not present

## 2022-01-30 DIAGNOSIS — M25551 Pain in right hip: Secondary | ICD-10-CM | POA: Diagnosis not present

## 2022-01-30 DIAGNOSIS — R053 Chronic cough: Secondary | ICD-10-CM | POA: Diagnosis not present

## 2022-01-30 DIAGNOSIS — C7800 Secondary malignant neoplasm of unspecified lung: Secondary | ICD-10-CM | POA: Diagnosis not present

## 2022-01-30 DIAGNOSIS — Z79899 Other long term (current) drug therapy: Secondary | ICD-10-CM | POA: Diagnosis not present

## 2022-01-30 DIAGNOSIS — C434 Malignant melanoma of scalp and neck: Secondary | ICD-10-CM | POA: Diagnosis not present

## 2022-01-30 DIAGNOSIS — C7801 Secondary malignant neoplasm of right lung: Secondary | ICD-10-CM | POA: Diagnosis not present

## 2022-01-30 DIAGNOSIS — E890 Postprocedural hypothyroidism: Secondary | ICD-10-CM | POA: Diagnosis not present

## 2022-01-30 DIAGNOSIS — C7951 Secondary malignant neoplasm of bone: Secondary | ICD-10-CM | POA: Diagnosis not present

## 2022-01-30 DIAGNOSIS — M25561 Pain in right knee: Secondary | ICD-10-CM | POA: Diagnosis not present

## 2022-01-30 DIAGNOSIS — Z79890 Hormone replacement therapy (postmenopausal): Secondary | ICD-10-CM | POA: Diagnosis not present

## 2022-01-30 DIAGNOSIS — C787 Secondary malignant neoplasm of liver and intrahepatic bile duct: Secondary | ICD-10-CM | POA: Diagnosis not present

## 2022-01-30 DIAGNOSIS — G893 Neoplasm related pain (acute) (chronic): Secondary | ICD-10-CM | POA: Diagnosis not present

## 2022-01-30 DIAGNOSIS — R5383 Other fatigue: Secondary | ICD-10-CM | POA: Diagnosis not present

## 2022-02-08 ENCOUNTER — Encounter: Payer: Self-pay | Admitting: Neurology

## 2022-02-11 DIAGNOSIS — H11823 Conjunctivochalasis, bilateral: Secondary | ICD-10-CM | POA: Diagnosis not present

## 2022-02-11 DIAGNOSIS — H18593 Other hereditary corneal dystrophies, bilateral: Secondary | ICD-10-CM | POA: Diagnosis not present

## 2022-02-11 DIAGNOSIS — Z961 Presence of intraocular lens: Secondary | ICD-10-CM | POA: Diagnosis not present

## 2022-02-11 DIAGNOSIS — H11421 Conjunctival edema, right eye: Secondary | ICD-10-CM | POA: Diagnosis not present

## 2022-02-25 DIAGNOSIS — K5730 Diverticulosis of large intestine without perforation or abscess without bleeding: Secondary | ICD-10-CM | POA: Diagnosis not present

## 2022-02-25 DIAGNOSIS — K3189 Other diseases of stomach and duodenum: Secondary | ICD-10-CM | POA: Diagnosis not present

## 2022-02-25 DIAGNOSIS — C7951 Secondary malignant neoplasm of bone: Secondary | ICD-10-CM | POA: Diagnosis not present

## 2022-02-25 DIAGNOSIS — R918 Other nonspecific abnormal finding of lung field: Secondary | ICD-10-CM | POA: Diagnosis not present

## 2022-02-25 DIAGNOSIS — J90 Pleural effusion, not elsewhere classified: Secondary | ICD-10-CM | POA: Diagnosis not present

## 2022-02-25 DIAGNOSIS — I3139 Other pericardial effusion (noninflammatory): Secondary | ICD-10-CM | POA: Diagnosis not present

## 2022-02-25 DIAGNOSIS — C439 Malignant melanoma of skin, unspecified: Secondary | ICD-10-CM | POA: Diagnosis not present

## 2022-02-25 DIAGNOSIS — J9811 Atelectasis: Secondary | ICD-10-CM | POA: Diagnosis not present

## 2022-02-25 DIAGNOSIS — R590 Localized enlarged lymph nodes: Secondary | ICD-10-CM | POA: Diagnosis not present

## 2022-02-25 DIAGNOSIS — I700 Atherosclerosis of aorta: Secondary | ICD-10-CM | POA: Diagnosis not present

## 2022-02-28 DIAGNOSIS — C439 Malignant melanoma of skin, unspecified: Secondary | ICD-10-CM | POA: Diagnosis not present

## 2022-02-28 DIAGNOSIS — L821 Other seborrheic keratosis: Secondary | ICD-10-CM | POA: Diagnosis not present

## 2022-02-28 DIAGNOSIS — Z85820 Personal history of malignant melanoma of skin: Secondary | ICD-10-CM | POA: Diagnosis not present

## 2022-02-28 DIAGNOSIS — D2271 Melanocytic nevi of right lower limb, including hip: Secondary | ICD-10-CM | POA: Diagnosis not present

## 2022-02-28 DIAGNOSIS — L578 Other skin changes due to chronic exposure to nonionizing radiation: Secondary | ICD-10-CM | POA: Diagnosis not present

## 2022-02-28 DIAGNOSIS — D2272 Melanocytic nevi of left lower limb, including hip: Secondary | ICD-10-CM | POA: Diagnosis not present

## 2022-02-28 DIAGNOSIS — D2230 Melanocytic nevi of unspecified part of face: Secondary | ICD-10-CM | POA: Diagnosis not present

## 2022-02-28 DIAGNOSIS — L720 Epidermal cyst: Secondary | ICD-10-CM | POA: Diagnosis not present

## 2022-02-28 DIAGNOSIS — D2372 Other benign neoplasm of skin of left lower limb, including hip: Secondary | ICD-10-CM | POA: Diagnosis not present

## 2022-02-28 DIAGNOSIS — D225 Melanocytic nevi of trunk: Secondary | ICD-10-CM | POA: Diagnosis not present

## 2022-02-28 DIAGNOSIS — Z85828 Personal history of other malignant neoplasm of skin: Secondary | ICD-10-CM | POA: Diagnosis not present

## 2022-02-28 DIAGNOSIS — Z86018 Personal history of other benign neoplasm: Secondary | ICD-10-CM | POA: Diagnosis not present

## 2022-03-01 DIAGNOSIS — E039 Hypothyroidism, unspecified: Secondary | ICD-10-CM | POA: Diagnosis not present

## 2022-03-01 DIAGNOSIS — C439 Malignant melanoma of skin, unspecified: Secondary | ICD-10-CM | POA: Diagnosis not present

## 2022-03-01 DIAGNOSIS — C787 Secondary malignant neoplasm of liver and intrahepatic bile duct: Secondary | ICD-10-CM | POA: Diagnosis not present

## 2022-03-01 DIAGNOSIS — C7951 Secondary malignant neoplasm of bone: Secondary | ICD-10-CM | POA: Diagnosis not present

## 2022-03-01 DIAGNOSIS — C434 Malignant melanoma of scalp and neck: Secondary | ICD-10-CM | POA: Diagnosis not present

## 2022-03-01 DIAGNOSIS — R946 Abnormal results of thyroid function studies: Secondary | ICD-10-CM | POA: Diagnosis not present

## 2022-03-01 DIAGNOSIS — Z791 Long term (current) use of non-steroidal anti-inflammatories (NSAID): Secondary | ICD-10-CM | POA: Diagnosis not present

## 2022-03-01 DIAGNOSIS — R5383 Other fatigue: Secondary | ICD-10-CM | POA: Diagnosis not present

## 2022-03-01 DIAGNOSIS — G893 Neoplasm related pain (acute) (chronic): Secondary | ICD-10-CM | POA: Diagnosis not present

## 2022-03-12 DIAGNOSIS — C434 Malignant melanoma of scalp and neck: Secondary | ICD-10-CM | POA: Diagnosis not present

## 2022-03-12 DIAGNOSIS — C7951 Secondary malignant neoplasm of bone: Secondary | ICD-10-CM | POA: Diagnosis not present

## 2022-05-06 DEATH — deceased

## 2022-08-20 ENCOUNTER — Ambulatory Visit: Payer: PPO | Admitting: Neurology

## 2022-08-27 ENCOUNTER — Ambulatory Visit: Payer: PPO | Admitting: Neurology

## 2022-09-04 IMAGING — RF DG ESOPHAGUS
13 of 15 series · 14 of 24 positions shown · non-contrast
Comparison: DG Esophagus [DATE]

CLINICAL DATA: Dysphagia. Patient reports food getting stuck in
distal esophagus. Prior [DATE] study showed hiatal hernia and
dysmotility.

EXAM:
ESOPHAGUS/BARIUM SWALLOW/TABLET STUDY
TECHNIQUE: Combined double and single contrast examination was performed using
effervescent crystals, high-density barium, and thin liquid barium.
This exam was performed by Mitul Herzog, PA, and was supervised
and interpreted by Besoin, Katirina.
FLUOROSCOPY TIME:  Radiation Exposure Index (as provided by the
fluoroscopic device): 19.951 mGy

[Series 1: sequence · 1 of 20 frames shown (1 of 13)]
[frame 4/20]
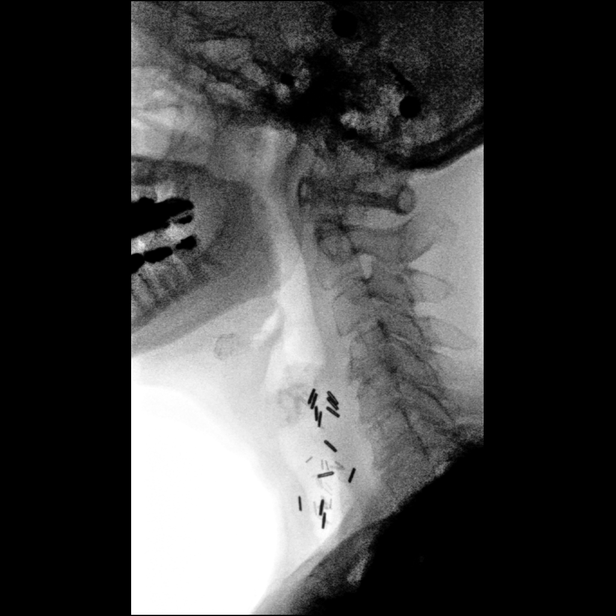

[Series 2: sequence · 1 of 17 frames shown (2 of 13)]
[frame 3/17]
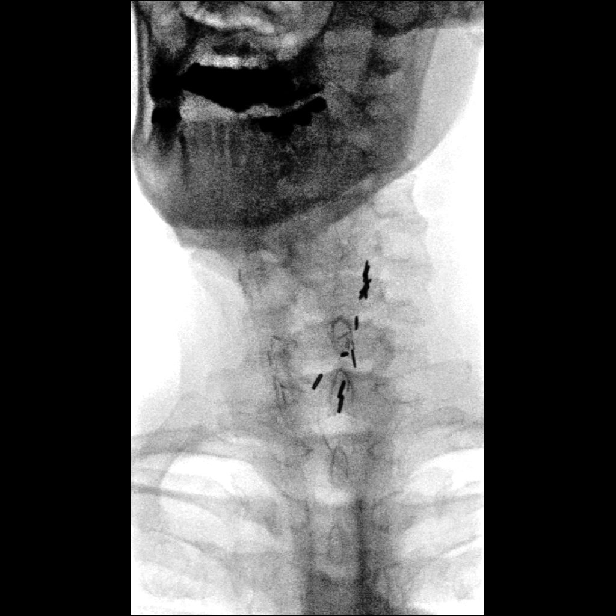

[Series 3: sequence · 0.31mm/px · 1 of 8 frames shown (3 of 13)]
[frame 2/8]
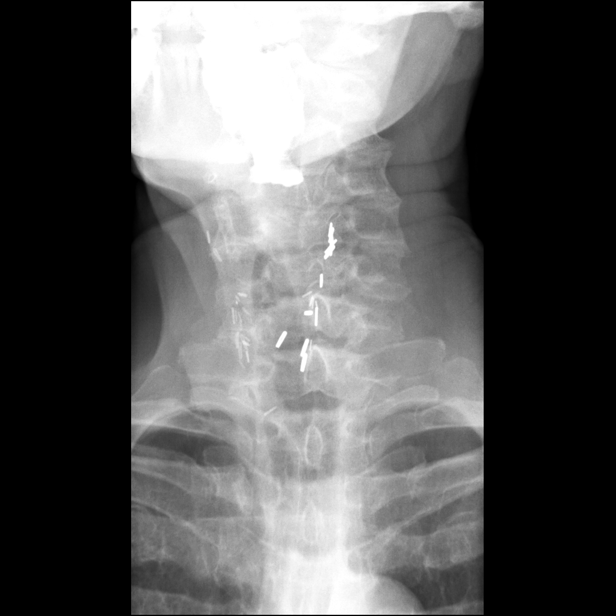

[Series 4: sequence · 0.31mm/px · 1 of 2 frames shown (4 of 13)]
[frame 2/2]
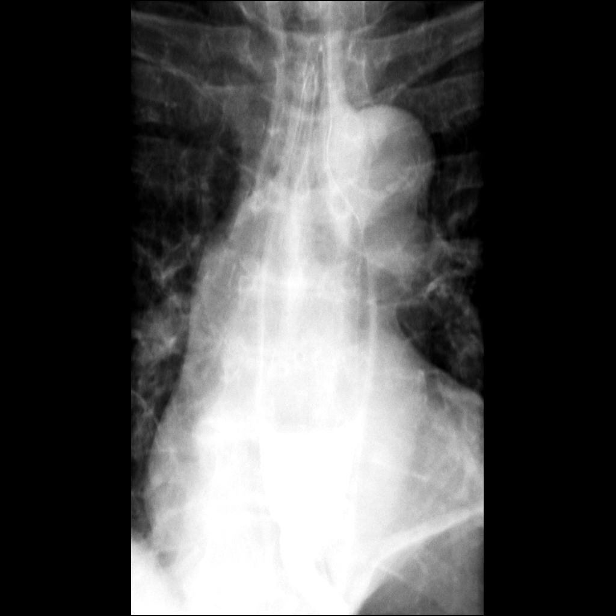

[Series 5: sequence · 0.31mm/px · 1 of 3 frames shown (5 of 13)]
[frame 2/3]
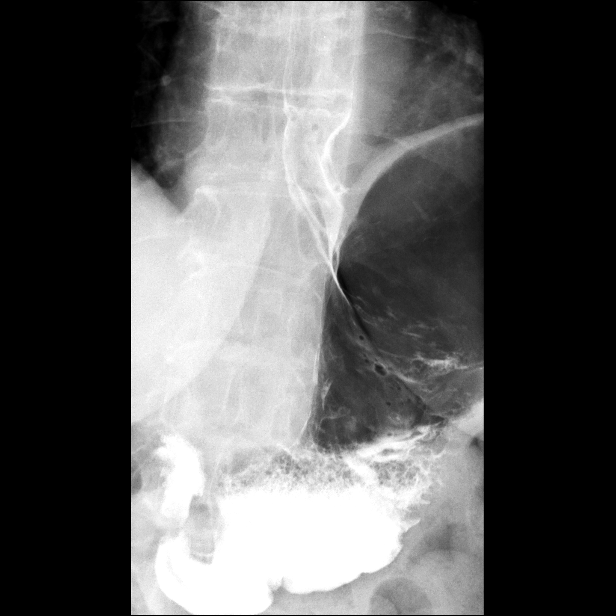

[Series 7: sequence · 0.31mm/px · 1 of 2 frames shown (6 of 13)]
[frame 1/2]
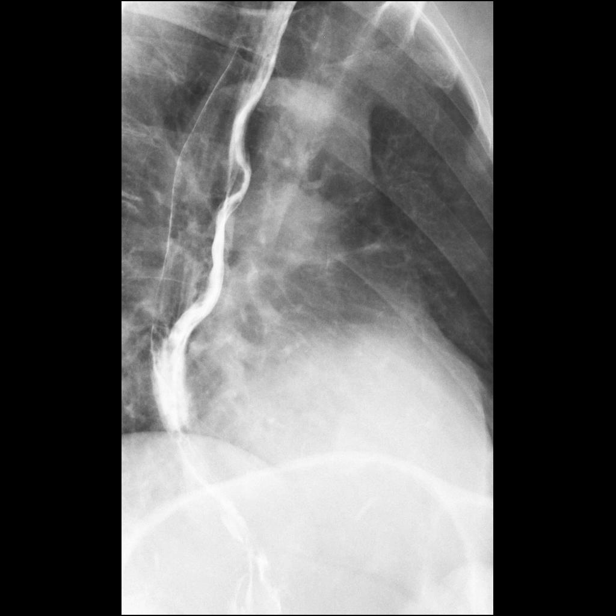

[Series 8: sequence · 0.31mm/px · 1 of 3 frames shown (7 of 13)]
[frame 3/3]
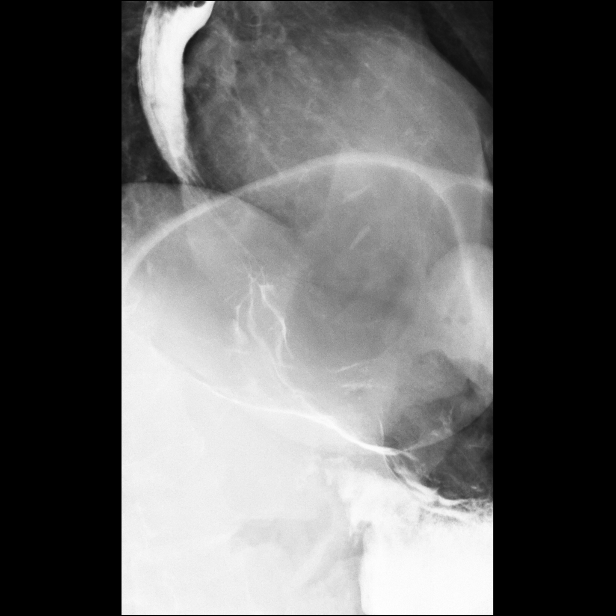

[Series 9: sequence · 0.31mm/px · 1 of 8 frames shown (8 of 13)]
[frame 2/8]
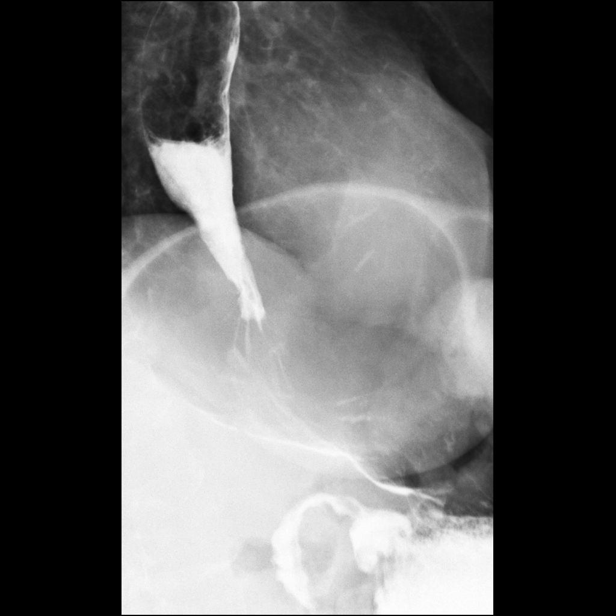

[Series 11: sequence · 0.31mm/px · 1 of 1 slices shown (9 of 13)]
[im 1/1]
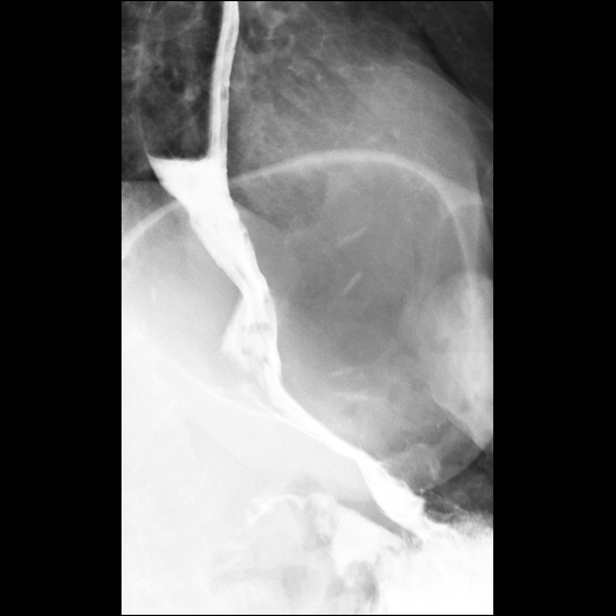

[Series 13: sequence · 1 of 43 frames shown (10 of 13)]
[frame 14/43]
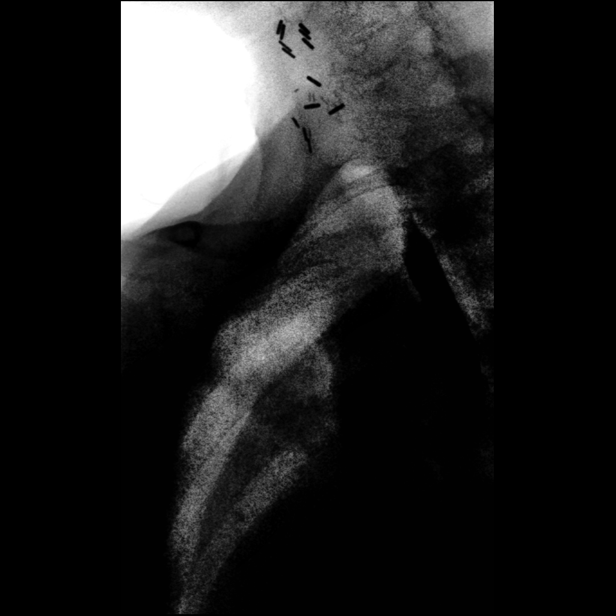

[Series 14: sequence · 2 of 12 frames shown (11 of 13)]
[frame 5/12]
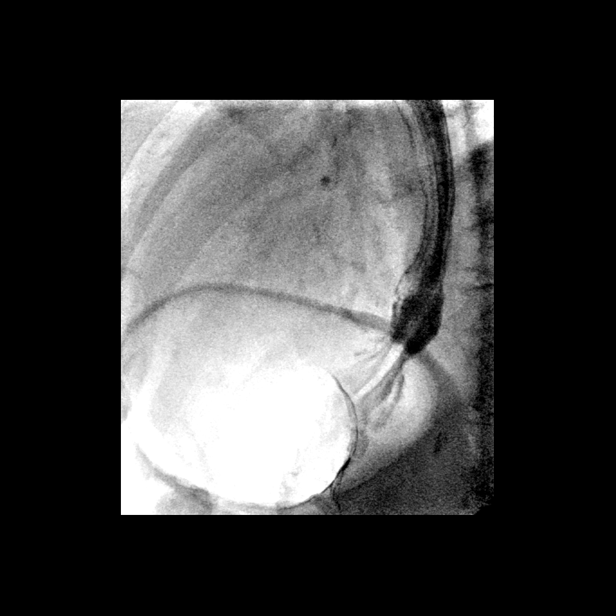
[frame 11/12]
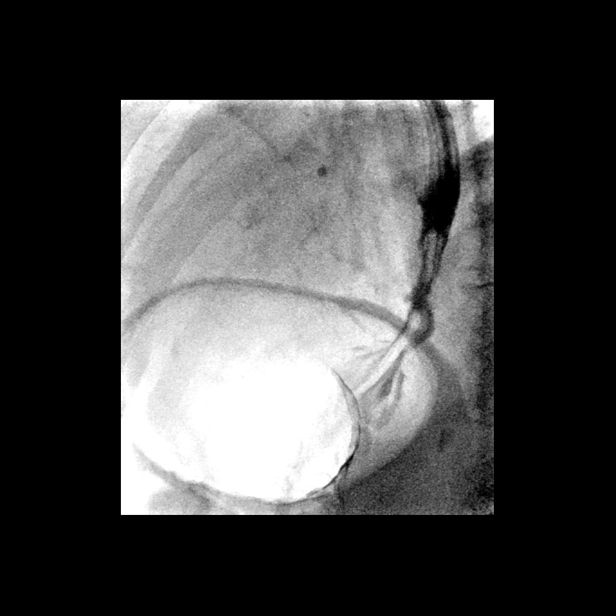

[Series 15: sequence · 1 of 26 frames shown (12 of 13)]
[frame 23/26]
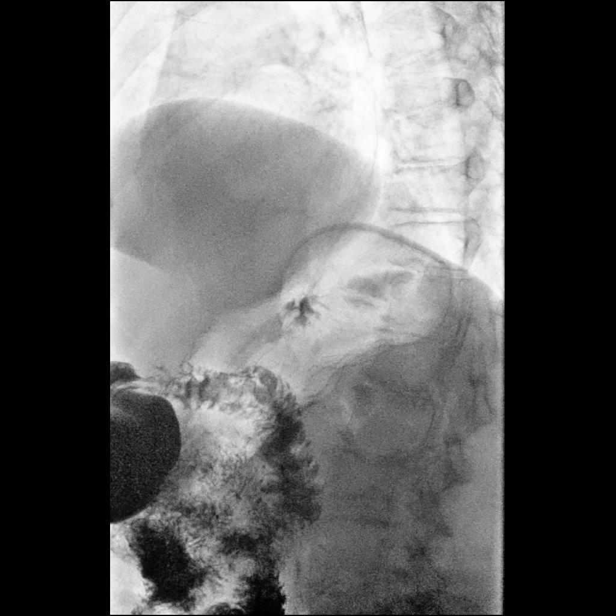

[Series 16: sequence · 1 of 24 frames shown (13 of 13)]
[frame 21/24]
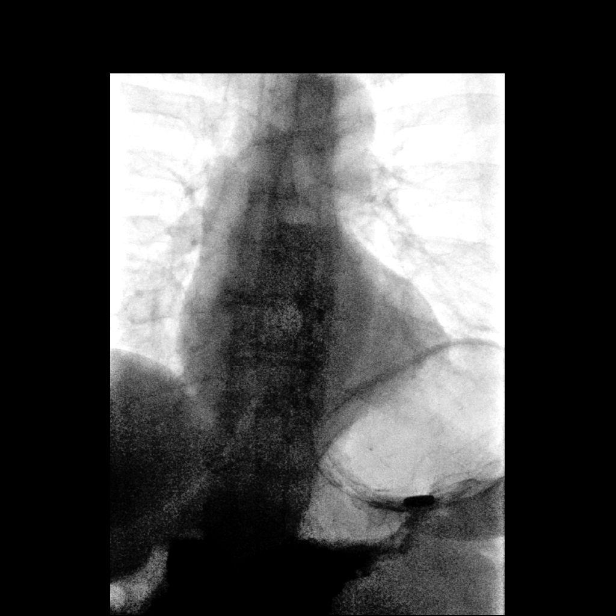

[14 of 24 positions shown; findings below may reference images not displayed]

FINDINGS: Swallowing: Appears normal. No vestibular penetration or aspiration
seen.

Pharynx: No gross abnormality. Thyroidectomy surgical clips overlie
the lower neck.

Esophagus: Mildly patulous thoracic esophagus. No evidence of
esophageal mass, stricture or ulcer.

Esophageal motility: Intermittent weakened primary peristalsis with
intermittent mild delayed clearance.

Hiatal Hernia: Small, sliding-type hiatal hernia.

Gastroesophageal reflux: Mild reflux to the distal esophagus.

Ingested 13mm barium tablet: Passed normally

Other: Small cervical osteophytes, multilevel
IMPRESSION: 1. Small hiatal hernia.  Mild gastroesophageal reflux elicited.
2. Mild esophageal dysmotility and mildly patulous thoracic
esophagus, characteristic of chronic reflux related dysmotility.
3. No evidence of esophageal mass or stricture.
